# Patient Record
Sex: Female | Born: 1975 | Hispanic: No | Smoking: Never smoker
Health system: Southern US, Community
[De-identification: ages and names within clinical notes are randomized; demographics above are authoritative.]

## PROBLEM LIST (undated history)

## (undated) DIAGNOSIS — F329 Major depressive disorder, single episode, unspecified: Secondary | ICD-10-CM

## (undated) DIAGNOSIS — R51 Headache: Secondary | ICD-10-CM

## (undated) DIAGNOSIS — H539 Unspecified visual disturbance: Secondary | ICD-10-CM

## (undated) DIAGNOSIS — R519 Headache, unspecified: Secondary | ICD-10-CM

## (undated) HISTORY — DX: Headache: R51

## (undated) HISTORY — PX: CHOLECYSTECTOMY OPEN: SUR202

## (undated) HISTORY — DX: Headache, unspecified: R51.9

## (undated) HISTORY — DX: Major depressive disorder, single episode, unspecified: F32.9

## (undated) HISTORY — DX: Unspecified visual disturbance: H53.9

---

## 2001-08-01 ENCOUNTER — Other Ambulatory Visit: Admission: RE | Admit: 2001-08-01 | Discharge: 2001-08-01 | Payer: Self-pay | Admitting: *Deleted

## 2014-08-27 DIAGNOSIS — H469 Unspecified optic neuritis: Secondary | ICD-10-CM | POA: Insufficient documentation

## 2014-09-26 ENCOUNTER — Telehealth: Payer: Self-pay | Admitting: *Deleted

## 2014-09-26 ENCOUNTER — Encounter: Payer: Self-pay | Admitting: Neurology

## 2014-09-26 ENCOUNTER — Ambulatory Visit (INDEPENDENT_AMBULATORY_CARE_PROVIDER_SITE_OTHER): Payer: BC Managed Care – PPO | Admitting: Neurology

## 2014-09-26 VITALS — BP 116/86 | HR 88 | Resp 16 | Ht 67.0 in | Wt 253.6 lb

## 2014-09-26 DIAGNOSIS — H469 Unspecified optic neuritis: Secondary | ICD-10-CM | POA: Diagnosis not present

## 2014-09-26 DIAGNOSIS — R350 Frequency of micturition: Secondary | ICD-10-CM | POA: Diagnosis not present

## 2014-09-26 DIAGNOSIS — R260 Ataxic gait: Secondary | ICD-10-CM | POA: Diagnosis not present

## 2014-09-26 DIAGNOSIS — F329 Major depressive disorder, single episode, unspecified: Secondary | ICD-10-CM | POA: Diagnosis not present

## 2014-09-26 DIAGNOSIS — E559 Vitamin D deficiency, unspecified: Secondary | ICD-10-CM | POA: Diagnosis not present

## 2014-09-26 DIAGNOSIS — F32A Depression, unspecified: Secondary | ICD-10-CM

## 2014-09-26 HISTORY — DX: Depression, unspecified: F32.A

## 2014-09-26 NOTE — Telephone Encounter (Signed)
Release faxed to [facility] on 09/26/14.

## 2014-09-26 NOTE — Progress Notes (Signed)
[facility]  PATIENT: Mary Ross DOB: Jan 25, 1976  REFERRING DOCTOR OR PCP:  Charlaine Dalton, MD, PhD SOURCE: patient and records form [facility]  _________________________________   HISTORICAL  CHIEF COMPLAINT:  Chief Complaint  Patient presents with  . Optic Neuritis    Sts. she began having decreased vision left eye at the end of January-"like there was a layer of vaseline over my eye." She saw her optometrist who ordered an mri, told her she had sinus infection--he rx'd a Z-pack and oral steroids which she sts. didn't help.  Her vision continued to worsen so she saw her opthalmologist who referred her to Dr. Metta Clines.  She sts. Dr. Metta Clines admitted her to [facility] for IV SoluMedrol (5 days).  Sts. he told her mri is inconclusive.  Sts. she also had an lp   . Abnormal MRI    which was negative for oligoclonal bands.  Also sts. all other labwork has been normal./fim    HISTORY OF PRESENT ILLNESS:  I had the pleasure seeing your patient, Mary Ross, at [facility] for neurologic consultation regarding her optic neuritis and possible multiple sclerosis. As you know, she is a 39 year old woman who began to note visual disturbance dye in late January 2016. At the same tome, she also noted left eye pain, worse with movements.  Over week, vision loss spread from the central region to the periphery and she was only able to see out of a small corner of her visual field out of her left eye.   Vision was normal out of the right. She saw an optometrist who noted papilitis and ordered an MRI that showed enhancement of the left optic nerve and also showing a small white matter focus in the right parietal periventricular region.   The focus was on diffusion-weighted images but did not show up on flare or ADC.    It did not enhance. She was given oral steroids but vision continued to worsen. She saw an ophthalmologist who referred her  to Dr. Metta Clines.  She saw Dr. Metta Clines on 08/27/2014 who started a course of IV Solu-Medrol (5 days) and set up a lumbar puncture. Spinal fluid from 09/02/2014 showed no oligoclonal bands and normal IgG index of 0.6. The myelin basic protein was minimally elevated at 2.0. The rest of the studies were normal or negative. She saw Dr. Metta Clines in follow-up on 09/12/2014. She felt her vision was somewhat improved since the course of steroids labs were drawn for ESR, ANA, ACE and and NMO IgG antibody.   She saw him again in follow-up on 09/24/2014. Vision proved for further. The ESR, ANA and Ace were normal or negative. The NMO Ab was not back.    She was referred for a second opinion regarding the possibility of multiple sclerosis.  She feels her vision is better than last month but only about 40% better overall (only mildly better central vision, much better peripheral).   The eye pain went away with the steroids.  She has also noted mild right visual blurring and was found to be only 20/40 with her contact in.    Colors are very washed out OS.    When she looks back in time, she denies any days to weeks long period of numbness, weakness, clumsiness.   She notes no gait issues.   She notes some urinary urgency and frequency over the past 2 months.  She had incontinence twice, one not during the steroid use.  She notes no change in fatigue or cognition.  REVIEW OF SYSTEMS: Constitutional: No fevers, chills, sweats, or change in appetite Eyes: as above.  No current eye pain Ear, nose and throat: No hearing loss, ear pain, nasal congestion, sore throat Cardiovascular: No chest pain, palpitations Respiratory: No shortness of breath at rest or with exertion.   No wheezes GastrointestinaI: No nausea, vomiting, diarrhea, abdominal pain, fecal incontinence Genitourinary: No dysuria or urinary retention.  She reports frequency and nocturia. Musculoskeletal: No neck pain, back pain Integumentary: No  rash, pruritus, skin lesions Neurological: as above Psychiatric: mild depression, better with prozac.  No anxiety Endocrine: No palpitations, diaphoresis, change in appetite, change in weigh or increased thirst Hematologic/Lymphatic: No anemia, purpura, petechiae. Allergic/Immunologic: No itchy/runny eyes, nasal congestion, recent allergic reactions, rashes  ALLERGIES: No Known Allergies  HOME MEDICATIONS:  Current outpatient prescriptions:  .  FLUoxetine (PROZAC) 40 MG capsule, Take 40 mg by mouth daily., Disp: , Rfl:   PAST MEDICAL HISTORY: Past Medical History  Diagnosis Date  . Depression 09/26/2014  . Headache   . Vision abnormalities     PAST SURGICAL HISTORY: Past Surgical History  Procedure Laterality Date  . Cholecystectomy open    . Cesarean section      FAMILY HISTORY: Family History  Problem Relation Age of Onset  . High Cholesterol Mother   . Hyperthyroidism Mother   . Hypertension Father     SOCIAL HISTORY:  History   Social History  . Marital Status: Unknown    Spouse Name: N/A  . Number of Children: N/A  . 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 Smoking status: Never Smoker   . Smokeless tobacco: Not on file  . Alcohol Use: No  . Drug Use: No  . Sexual Activity: Not on file   Other Topics Concern  . Not on file   Social History Narrative  . No narrative on file     PHYSICAL EXAM  Filed Vitals:   09/26/14 1003  BP: 116/86  Pulse: 88  Resp: 16  Height: 5' 7"  (1.702 m)  Weight: 253 lb 9.6 oz (115.032 kg)    Body mass index is 39.71 kg/(m^2).   General: The patient is well-developed and well-nourished and in no acute distress  Eyes:  Funduscopic exam shows optic nerve pallor in the left and normal retinal vessels..  Neck: The neck is supple, no carotid bruits are noted.  The neck is nontender.  Cardiovascular: The heart has  a regular rate and rhythm with a normal S1 and S2. There were no murmurs, gallops or rubs. Lungs are clear to auscultation.  Skin: Extremities are without significant edema.  Musculoskeletal:  Back is nontender  Neurologic Exam  Mental status: The patient is alert and oriented x 3 at the time of the examination. The patient has apparent normal recent and remote memory, with an apparently normal attention span and concentration ability.   Speech is normal.  Cranial nerves: Extraocular movements are full. Pupils show a 2+ APD visual acuity is 20/40 on the right and worse than 20/200 on the left. Color is  very desaturated out of OS..  Visual fields tio confrontation are nearly full on the left.  Facial symmetry is present. There is good facial sensation to soft touch bilaterally.Facial strength is normal.  Trapezius and sternocleidomastoid strength is normal. No dysarthria is noted.  The tongue is midline, and the patient has symmetric elevation of the soft palate. No obvious hearing deficits are noted.  Motor:  Muscle bulk is normal.   Tone is normal. Strength is  5 / 5 in all 4 extremities.   Sensory: Sensory testing is intact to pinprick, soft touch and vibration sensation in all 4 extremities.  Coordination: Cerebellar testing reveals good finger-nose-finger and heel-to-shin bilaterally.  Gait and station: Station is normal.   Gait is normal. Tandem gait is mildly wide. Romberg is negative.   Reflexes: Deep tendon reflexes are symmetric and normal bilaterally.   Plantar responses are flexor.    DIAGNOSTIC DATA (LABS, IMAGING, TESTING) - I reviewed patient records, labs, notes, testing and imaging myself where available.     ASSESSMENT AND PLAN  Optic neuritis - Plan: Vitamin D, 25-hydroxy, MR Cervical Spine W Wo Contrast, MR Thoracic Spine W Wo Contrast  Depression  Ataxic gait - Plan: MR Cervical Spine W Wo Contrast, MR Thoracic Spine W Wo Contrast  Urinary frequency - Plan:  MR Cervical Spine W Wo Contrast, MR Thoracic Spine W Wo Contrast  Vitamin D deficiency - Plan: Vitamin D, 25-hydroxy    In summary, Mary Ross is a 39 year old woman who developed optic neuritis about 8 weeks ago and had an MRI one focus that appeared on diffusion-weighted images but not on FLAIR images. Her recovery is only about 30 or 40% at this point. Vasculitis labs and CSF were negative. NMO antibody is still pending.   She has several symptoms that could be consistent with a mild transverse myelitis including urinary frequency with a couple episodes of incontinence and a slightly ataxic gait. We will check an MRI of the cervical and thoracic spine to assess this possibility. If she does have a spinal cord plaque, MS is much more likely and we will consider starting therapy. I reviewed the results of the optic neuritis treatment trial from the 90s with her and her mom. Study found that patients with one or 2 MRI lesions had a 30% chance of being diagnosed with MS at 5 years, a 50% chance of being diagnosed at 10 years and about a 65% chance of being diagnosed by 15 years.   Therefore, I think the best approach would be to recheck an MRI of the brain in 2-3 months (assuming the spinal MRIs do not lead to a diagnosis of MS) and checking again 9 or 10 months later if that brain MRI shows no new lesions. We will also check a vitamin D level today as low vitamin D has been associated with a higher likelihood of developing MS I will hold off on treating the urinary frequency but would consider oxybutynin or similar agent if symptoms worsen.  She will return to see me in 2 months or sooner if she has new or worsening neurologic symptoms.   Richard A. Felecia Shelling, MD, PhD 5/32/0233, 43:56 AM Certified in Neurology, Clinical Neurophysiology, Sleep Medicine, Pain Medicine and Neuroimaging  [facility] [address], [place], [place] [address] [phone]

## 2014-09-27 ENCOUNTER — Telehealth: Payer: Self-pay | Admitting: *Deleted

## 2014-09-27 LAB — VITAMIN D 25 HYDROXY (VIT D DEFICIENCY, FRACTURES): VIT D 25 HYDROXY: 18.4 ng/mL — AB (ref 30.0–100.0)

## 2014-09-27 NOTE — Telephone Encounter (Signed)
LMTC./fim 

## 2014-09-27 NOTE — Telephone Encounter (Signed)
-----   Message from Richard A Sater, MD sent at 09/27/2014  9:57 AM EDT ----- Vit D is low   50000 U x 12 weeks then 4000-5000 U OTC 

## 2014-09-30 ENCOUNTER — Telehealth: Payer: Self-pay | Admitting: *Deleted

## 2014-09-30 MED ORDER — VITAMIN D (ERGOCALCIFEROL) 1.25 MG (50000 UNIT) PO CAPS: 50000.0000 [IU] | ORAL_CAPSULE | ORAL | Status: DC

## 2014-09-30 NOTE — Telephone Encounter (Signed)
Spoke with Bjorn Loserhonda and per RAS, advised her of low vitamin d level, and of the need for rx. vitamin d 50,000u weekly for 12 weeks, then a daily otc vit. d supplement of 4-5,000u.  She verbalized understanding of same and rx. escribed to [facility] in [place] per her request/fim

## 2014-09-30 NOTE — Telephone Encounter (Signed)
Patient returning call and requesting a return call on cell # [phone].

## 2014-09-30 NOTE — Telephone Encounter (Signed)
-----   Message from Asa Lenteichard A Sater, MD sent at 09/27/2014  9:57 AM EDT ----- Vit D is low   50000 U x 12 weeks then 4000-5000 U OTC

## 2014-09-30 NOTE — Telephone Encounter (Signed)
Spoke with Bjorn Loserhonda and per RAS, advised her of low vitamin d level, and of the need for rx. vit. d 50,000u weekly for 12 weeks, then an otc daily vit. d supplement of 4-5,000iu daily.  Haylynn verbalized understanding of same, and rx. escribed to [facility] in [place] per her request/fim

## 2014-10-03 ENCOUNTER — Other Ambulatory Visit: Payer: BC Managed Care – PPO

## 2014-10-03 ENCOUNTER — Ambulatory Visit (INDEPENDENT_AMBULATORY_CARE_PROVIDER_SITE_OTHER): Payer: BC Managed Care – PPO

## 2014-10-03 DIAGNOSIS — R260 Ataxic gait: Secondary | ICD-10-CM

## 2014-10-03 DIAGNOSIS — H469 Unspecified optic neuritis: Secondary | ICD-10-CM

## 2014-10-03 DIAGNOSIS — R350 Frequency of micturition: Secondary | ICD-10-CM | POA: Diagnosis not present

## 2014-10-04 MED ORDER — GADOPENTETATE DIMEGLUMINE 469.01 MG/ML IV SOLN
20.0000 mL | Freq: Once | INTRAVENOUS | Status: AC | PRN
Start: 2014-10-04 — End: 2014-10-04

## 2014-10-07 ENCOUNTER — Telehealth: Payer: Self-pay | Admitting: Neurology

## 2014-10-07 NOTE — Telephone Encounter (Signed)
I discussed the results of her MRIs. The cervical spine is normal and the thoracic spine is near normal with just minimal degenerative changes. However, in the interim, we have also received her NMO (aquaporin) antibody  And it is positive at 6.3 ( less than 3 is negative and greater than 5.1 is positive ). 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 We will set up an appointment for her to come in within a week or so to discuss options including Rituxan and Imuran/steroid.

## 2014-10-08 ENCOUNTER — Telehealth: Payer: Self-pay | Admitting: *Deleted

## 2014-10-08 NOTE — Telephone Encounter (Signed)
Spoke with Bjorn Loserhonda and per RAS, gave appt. next week (10-14-14 at 1340) to discuss tx. options/fim

## 2014-10-08 NOTE — Telephone Encounter (Signed)
LMTC./fim 

## 2014-10-08 NOTE — Telephone Encounter (Signed)
Spoke with Mary Ross and gave appt. 10-14-14 at 1340 to discuss tx. options/fim

## 2014-10-08 NOTE — Telephone Encounter (Signed)
-----   Message from Asa Lenteichard A Sater, MD sent at 10/07/2014  6:20 PM EDT ----- I spoke to her about NMO -Ab (positive) and MRI's (esentially negative)  Please call her to set up an appt to see me next week to go over treatment options (rituxan vs. Imuran in more detail) and to check labs

## 2014-10-08 NOTE — Telephone Encounter (Signed)
Patient is returning your call.    Thanks

## 2014-10-08 NOTE — Telephone Encounter (Signed)
-----   Message from Richard A Sater, MD sent at 10/07/2014  6:20 PM EDT ----- I spoke to her about NMO -Ab (positive) and MRI's (esentially negative)  Please call her to set up an appt to see me next week to go over treatment options (rituxan vs. Imuran in more detail) and to check labs 

## 2014-10-14 ENCOUNTER — Encounter: Payer: Self-pay | Admitting: Neurology

## 2014-10-14 ENCOUNTER — Ambulatory Visit (INDEPENDENT_AMBULATORY_CARE_PROVIDER_SITE_OTHER): Payer: BC Managed Care – PPO | Admitting: Neurology

## 2014-10-14 VITALS — BP 120/86 | HR 88 | Resp 16 | Ht 67.0 in | Wt 257.2 lb

## 2014-10-14 DIAGNOSIS — G360 Neuromyelitis optica [Devic]: Secondary | ICD-10-CM | POA: Diagnosis not present

## 2014-10-14 DIAGNOSIS — H469 Unspecified optic neuritis: Secondary | ICD-10-CM

## 2014-10-14 DIAGNOSIS — Z79899 Other long term (current) drug therapy: Secondary | ICD-10-CM | POA: Insufficient documentation

## 2014-10-14 NOTE — Progress Notes (Signed)
[facility]  PATIENT: Mary Ross DOB: [birth year]  REFERRING DOCTOR OR PCP:  Charlaine Dalton, MD, PhD SOURCE: patient and records form [facility]  _________________________________   HISTORICAL  CHIEF COMPLAINT:  Chief Complaint  Patient presents with  . Optic Neuritis    To discuss tx. options.  Sts. vision in left eye is improving/fim    HISTORY OF PRESENT ILLNESS:  Optic Neuritis history:   Mary Ross began to note visual disturbance on the left in late January 2016.  She had eye pain  Over a week, vision loss spread from the central region to the periphery and she was only able to see out of a small corner of her visual field out of her left eye.   Vision was normal out of the right. She saw an optometrist who noted papilitis and ordered an MRI that showed enhancement of the left optic nerve and also showing a small white matter focus in the right parietal periventricular region.   The focus was on diffusion-weighted images but did not show up on flare or ADC.    It did not enhance.   She saw Dr. Metta Clines on 08/27/2014 who started a course of IV Solu-Medrol (5 days) and set up a lumbar puncture. Spinal fluid from 09/02/2014 showed no oligoclonal bands and normal IgG index of 0.6. The myelin basic protein was minimally elevated at 2.0. The rest of the studies were normal or negative. She saw Dr. Metta Clines in follow-up on 09/12/2014. She felt her vision was somewhat improved since the course of steroids labs were drawn for ESR, ANA, ACE and and NMO IgG antibody.   She saw him again in follow-up on 09/24/2014. Vision proved for further. The ESR, ANA and Ace were normal or negative. The NMO Ab returned positive at 6.3.   She feels her vision is better than last visit but not baseline.   Colors are very washed out OS.    When she looks back in time, she denies any days to weeks long period of numbness, weakness, clumsiness.   She notes no gait issues.    She notes some urinary urgency and frequency over the past 2 months.  She had incontinence twice, one not during the steroid use.    She notes no change in fatigue or cognition.  Last month, we also checked spinal MRI and it was normal.  She returns today with parents to discuss treatment options  REVIEW OF SYSTEMS: Constitutional: No fevers, chills, sweats, or change in appetite.  No fatigue Eyes: as above.  No current eye pain Ear, nose and throat: No hearing loss, ear pain, nasal congestion, sore throat Cardiovascular: No chest pain, palpitations Respiratory: No shortness of breath at rest or with exertion.   No wheezes GastrointestinaI: No nausea, vomiting, diarrhea, abdominal pain, fecal incontinence Genitourinary: No dysuria or urinary retention.  She reports frequency and nocturia. Musculoskeletal: No neck pain.  Notes back pain Integumentary: No rash, pruritus, skin lesions Neurological: as above Psychiatric: mild depression, better with prozac.  No anxiety Endocrine: No palpitations, diaphoresis, change in appetite, change in weigh or increased thirst Hematologic/Lymphatic: No anemia, purpura, petechiae. Allergic/Immunologic: No itchy/runny eyes, nasal congestion, recent allergic reactions, rashes  ALLERGIES: No Known Allergies  HOME MEDICATIONS:  Current outpatient prescriptions:  .  FLUoxetine (PROZAC) 40 MG capsule, Take 40 mg by mouth daily., Disp: , Rfl:  .  Vitamin D, Ergocalciferol, (DRISDOL) 50000 UNITS CAPS capsule, Take 1 capsule (50,000 Units total) by mouth every  7 (seven) days., Disp: 12 capsule, Rfl: 0  PAST MEDICAL HISTORY: Past Medical History  Diagnosis Date  . Depression 09/26/2014  . Headache   . Vision abnormalities     PAST SURGICAL HISTORY: Past Surgical History  Procedure Laterality Date  . Cholecystectomy open    . Cesarean section      FAMILY HISTORY: Family History  Problem Relation Age of Onset  . High Cholesterol Mother   .  Hyperthyroidism Mother   . Hypertension Father     SOCIAL HISTORY:  History   Social History  . Marital Status: Unknown    Spouse Name: N/A  . Number of Children: N/A  . 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 Smoking status: Never Smoker   . Smokeless tobacco: Not on file  . Alcohol Use: No  . Drug Use: No  . Sexual Activity: Not on file   Other Topics Concern  . Not on file   Social History Narrative     PHYSICAL EXAM  Filed Vitals:   10/14/14 1359  BP: 120/86  Pulse: 88  Resp: 16  Height: _0  (1.702 m)  Weight: 257 lb 3.2 oz (116.665 kg)    Body mass index is 40.27 kg/(m^2).   General: The patient is well-developed and well-nourished and in no acute distress  Neurologic Exam  Mental status: The patient is alert and oriented x 3 at the time of the examination. The patient has apparent normal recent and remote memory, with an apparently normal attention span and concentration ability.   Speech is normal.  Cranial nerves: Extraocular movements are full. Pupils show a 2+ APD on the right. Color is  very desaturated out of OS.. VA is 20/30 OD and 20/70 OS (was 20/200 last month) .  Facial symmetry is present. There is good facial sensation to soft touch bilaterally.Facial strength is normal.  Trapezius and sternocleidomastoid strength is normal. No dysarthria is noted.  The tongue is midline, and the patient has symmetric elevation of the soft palate.   Motor:  Muscle bulk is normal.   Tone is normal. Strength is  5 / 5 in all 4 extremities. .  Coordination: Cerebellar testing reveals good finger-nose-finger bilaterally.  Gait and station: Station is normal.   Gait is normal. Tandem gait is minimally  wide.     DIAGNOSTIC DATA (LABS, IMAGING, TESTING) - I reviewed patient records, labs, notes, testing and imaging myself where available.     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, Hep B Surface Antibody, Hep B Surface Antigen, Hepatitis B Core AB, Total, Quantiferon tb gold assay, Lymphocyte subsets, flow cytometry (inpatient)  High risk medication use - Plan: Hep C Antibody, Hep B Surface Antibody, Hep B Surface Antigen, Hepatitis B Core AB, Total, Quantiferon tb gold assay, Lymphocyte subsets, flow cytometry (inpatient)  Optic neuritis - Plan: Hep C Antibody, Hep B Surface Antibody, Hep B Surface Antigen, Hepatitis B Core AB, Total, Quantiferon tb gold assay, Lymphocyte subsets, flow cytometry (inpatient)     In summary, Denver Harder is a 39 year old woman who developed optic neuritis earlier this year weeks ago and had an MRI showing one focus that appeared on diffusion-weighted images but not on FLAIR images. Her NMO-Ab was positive (about 6.3 with < 1.5 normal).  I had about a 30 minute conversation with her and her  parents about Devic's disease, possible treatments and the risks and benefits of the 2 most used treatments, steroids/Imuran and rituximab. I feel that rituximab is a more efficacious medication for her and will allow her to be spared the side effects of long-term use of steroids.    She understands that there could be a small risk of PML and other infections. She also understands that there is no FDA approved medication for Devic's disease as it is fairly rare after further discussion we decided to set her up for rituximab therapy 1000 mg, followed 2 weeks later by another 1000 milligrams IV and repeated in 6 month cycles.  She will return to see this office for her infusion and see me in again about 2 months later or sooner if she has new or worsening neurologic symptoms.  The 30 minute face-to-face interaction with greater than 50% of time counseling and coordinating care about Devic's disease, treatment options and other issues.   Richard A. Felecia Shelling, MD, PhD 10/12/5389, 2:25 PM Certified in Neurology, Clinical  Neurophysiology, Sleep Medicine, Pain Medicine and Neuroimaging  [facility] [address], [place], [place] [address] [phone]

## 2014-10-15 LAB — SPECIMEN STATUS REPORT

## 2014-10-16 LAB — QUANTIFERON IN TUBE
QUANTIFERON MITOGEN VALUE: >10 IU/mL
QUANTIFERON TB AG VALUE: 0.05 IU/mL
QUANTIFERON TB GOLD: NEGATIVE
Quantiferon Nil Value: 0.07 IU/mL

## 2014-10-16 LAB — HEPATITIS C ANTIBODY: Hep C Virus Ab: <0.1 s/co ratio (ref 0.0–0.9)

## 2014-10-16 LAB — HEPATITIS B SURFACE ANTIBODY,QUALITATIVE: HEP B SURFACE AB, QUAL: REACTIVE

## 2014-10-16 LAB — QUANTIFERON TB GOLD ASSAY (BLOOD)

## 2014-10-16 LAB — HEPATITIS B SURFACE ANTIGEN: Hepatitis B Surface Ag: NEGATIVE

## 2014-10-16 LAB — HEPATITIS B CORE ANTIBODY, TOTAL: Hep B Core Total Ab: NEGATIVE

## 2014-10-22 ENCOUNTER — Telehealth: Payer: Self-pay | Admitting: *Deleted

## 2014-10-22 NOTE — Telephone Encounter (Signed)
Spoke with Mary Ross and per RAS, advised Hep labs are negative; Rituxan orders have been given to [facility] in the infusion suite, and she should expect a call to schedule infusions once ins. has approved.  She verbalized understanding of same/fim

## 2014-10-22 NOTE — Telephone Encounter (Signed)
-----   Message from Asa Lenteichard A Sater, MD sent at 10/21/2014  4:54 PM EDT ----- Labs are fine    Please check to make sure Rituxan has  been ordered

## 2014-11-26 ENCOUNTER — Ambulatory Visit: Payer: BC Managed Care – PPO | Admitting: Neurology

## 2014-12-11 ENCOUNTER — Other Ambulatory Visit: Payer: Self-pay | Admitting: Neurology

## 2014-12-12 ENCOUNTER — Encounter: Payer: Self-pay | Admitting: *Deleted

## 2014-12-23 ENCOUNTER — Ambulatory Visit: Payer: BC Managed Care – PPO | Admitting: Neurology

## 2014-12-25 ENCOUNTER — Encounter: Payer: Self-pay | Admitting: Neurology

## 2015-01-10 ENCOUNTER — Ambulatory Visit: Payer: Self-pay | Admitting: Neurology

## 2015-01-14 ENCOUNTER — Ambulatory Visit (INDEPENDENT_AMBULATORY_CARE_PROVIDER_SITE_OTHER): Payer: BC Managed Care – PPO | Admitting: Neurology

## 2015-01-14 ENCOUNTER — Encounter: Payer: Self-pay | Admitting: Neurology

## 2015-01-14 VITALS — BP 126/74 | HR 70 | Resp 14 | Ht 67.0 in | Wt 278.0 lb

## 2015-01-14 DIAGNOSIS — G360 Neuromyelitis optica [Devic]: Secondary | ICD-10-CM | POA: Diagnosis not present

## 2015-01-14 DIAGNOSIS — R292 Abnormal reflex: Secondary | ICD-10-CM | POA: Diagnosis not present

## 2015-01-14 DIAGNOSIS — H469 Unspecified optic neuritis: Secondary | ICD-10-CM

## 2015-01-14 DIAGNOSIS — R208 Other disturbances of skin sensation: Secondary | ICD-10-CM

## 2015-01-14 NOTE — Progress Notes (Signed)
[facility]  PATIENT: Mary GoodellRhonda Ross DOB: 1976/07/05  REFERRING DOCTOR OR PCP:  Dale DurhamMichael Applegate, MD, PhD SOURCE: patient and records form [facility]  _________________________________   HISTORICAL  CHIEF COMPLAINT:  Chief Complaint  Patient presents with  . Devic's Disease    She sts. she tolerates Rituxan well.  Last infusions were 12-12-14 and 12-26-14.  Sts. her only complaint is continued achy pain bilat arms./fim    HISTORY OF PRESENT ILLNESS:  Devic's:   She tolerated the two Rituxan infusions without difficulty. Specifically, there was no infusion reaction and no allergic reactions afterwards.     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 Visual acuity has improved but not to baseline.  Colors are washed out OS.    Dysesthesia:  The second week in June, she began to note mild aching dysesthetic sensations in the upper arms bilateral.    Cervical spine MRI was normal in March 2016  When she looks back in time, she denies any days to weeks long period of numbness, weakness, clumsiness.   She notes no gait issues.   She notes some urinary urgency and frequency over the past 2 months.  She had incontinence twice, one not during the steroid use.    She notes no change in fatigue or cognition.   REVIEW OF SYSTEMS: Constitutional: No fevers, chills, sweats, or change in appetite.  No fatigue Eyes: as above.  No current eye pain Ear, nose and throat: No hearing loss, ear pain, nasal congestion, sore throat Cardiovascular: No chest pain, palpitations Respiratory: No shortness of breath at rest or with exertion.   No wheezes GastrointestinaI: No nausea, vomiting, diarrhea, abdominal pain, fecal incontinence Genitourinary: No dysuria or urinary retention.  She reports frequency and  nocturia. Musculoskeletal: No neck pain.  Notes back pain Integumentary: No rash, pruritus, skin lesions Neurological: as above Psychiatric: mild depression, better with prozac.  No anxiety Endocrine: No palpitations, diaphoresis, change in appetite, change in weigh or increased thirst Hematologic/Lymphatic: No anemia, purpura, petechiae. Allergic/Immunologic: No itchy/runny eyes, nasal congestion, recent allergic reactions, rashes  ALLERGIES: No Known Allergies  HOME MEDICATIONS:  Current outpatient prescriptions:  .  FLUoxetine (PROZAC) 40 MG capsule, Take 40 mg by mouth daily., Disp: , Rfl:  .  riTUXimab (RITUXAN) 100 MG/10ML injection, Inject 1,000 mg into the vein., Disp: , Rfl:  .  Vitamin D, Ergocalciferol, (DRISDOL) 50000 UNITS CAPS capsule, Take 1 capsule (50,000 Units total) by mouth every 7 (seven) days., Disp: 12 capsule, Rfl: 0  PAST MEDICAL HISTORY: Past Medical History  Diagnosis Date  . Depression 09/26/2014  . Headache   . Vision abnormalities     PAST SURGICAL HISTORY: Past Surgical History  Procedure Laterality Date  . Cholecystectomy open    . Cesarean section      FAMILY HISTORY: Family History  Problem Relation Age of Onset  . High Cholesterol Mother   . Hyperthyroidism Mother   . Hypertension Father     SOCIAL HISTORY:  History   Social History  . Marital Status: Unknown    Spouse Name: N/A  . Number of Children: N/A  . Years of Education: N/A   Occupational History  . Health Educator     [employer]   Social History Main Topics  . Smoking status: Never Smoker   . Smokeless tobacco:  Not on file  . Alcohol Use: No  . Drug Use: No  . Sexual Activity: Not on file   Other Topics Concern  . Not on file   Social History Narrative     PHYSICAL EXAM  Filed Vitals:   01/14/15 1142  BP: 126/74  Pulse: 70  Resp: 14  Height:  (1.702 m)  Weight: 278 lb (126.1 kg)    Body mass index is 43.53  kg/(m^2).   General: The patient is well-developed and well-nourished and in no acute distress  Neurologic Exam  Mental status: The patient is alert and oriented x 3 at the time of the examination. The patient has apparent normal recent and remote memory, with an apparently normal attention span and concentration ability.   Speech is normal.  Cranial nerves: Extraocular movements are full. Pupils show a 2+ APD on the right. Color is  desaturated out of OS.Marland Kitchen VFFTC.    Facial symmetry is present. There is good facial sensation to soft touch bilaterally.Facial strength is normal.  Trapezius and sternocleidomastoid strength is normal. No dysarthria is noted.  The tongue is midline, and the patient has symmetric elevation of the soft palate.   Motor:  Muscle bulk is normal.   Tone is normal. Strength is  5 / 5 in all 4 extremities.   Sensation:  She has intact touch and vibration sensation in the arms and legs.  Coordination: Cerebellar testing reveals good finger-nose-finger bilaterally.  Gait and station: Station is normal.   Gait is normal. Tandem gait is wide.   Romberg sign is absent.  DTRs:  Elevated reflexes at the knees with spread at the ankles with nonsustained clonus.    DIAGNOSTIC DATA (LABS, IMAGING, TESTING) - I reviewed patient records, labs, notes, testing and imaging myself where available.     ASSESSMENT AND PLAN  Devic's disease  Optic neuritis  Dysesthesia  Hyperreflexia of lower extremity   1.  Consider repeat imaging of the cervical spine if the arm dysesthesias or gait issues worsened. If this does worsen, I would consider a course of IV steroids as well.  2.   Her next infusions will be in December. I will want to see her back in early November. At that time, I will check a CD19 count to make sure that her B cells remain 0 and to check to see if the anti-NMO antibody titer has improved.  3.  She will call sooner if any new or worsening neurologic  symptoms.   Richard A. Epimenio Foot, MD, PhD 01/14/2015, 11:55 AM Certified in Neurology, Clinical Neurophysiology, Sleep Medicine, Pain Medicine and Neuroimaging  [facility] [address], [address] [place], [place] [address] [phone]

## 2015-05-19 ENCOUNTER — Ambulatory Visit (INDEPENDENT_AMBULATORY_CARE_PROVIDER_SITE_OTHER): Payer: BC Managed Care – PPO | Admitting: Neurology

## 2015-05-19 ENCOUNTER — Encounter: Payer: Self-pay | Admitting: Neurology

## 2015-05-19 VITALS — BP 118/88 | HR 78 | Resp 16 | Ht 67.0 in | Wt 278.4 lb

## 2015-05-19 DIAGNOSIS — F329 Major depressive disorder, single episode, unspecified: Secondary | ICD-10-CM

## 2015-05-19 DIAGNOSIS — G360 Neuromyelitis optica [Devic]: Secondary | ICD-10-CM | POA: Diagnosis not present

## 2015-05-19 DIAGNOSIS — F32A Depression, unspecified: Secondary | ICD-10-CM

## 2015-05-19 DIAGNOSIS — Z79899 Other long term (current) drug therapy: Secondary | ICD-10-CM

## 2015-05-19 DIAGNOSIS — R350 Frequency of micturition: Secondary | ICD-10-CM

## 2015-05-19 NOTE — Progress Notes (Signed)
[facility]  PATIENT: Mary Ross DOB: 05-08-1976   _________________________________   HISTORICAL  CHIEF COMPLAINT:  Chief Complaint  Patient presents with  . Devic's Disease    Denies new or worsening sx. Last Rituxan infusions were in June.  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  She had Rituxan 1000 mg x 2 in June.    Her infusions went well.   She felt sleepy with Benadryl.   She has not noted any new symptoms since.     Devic's Disease:   She noted visual disturbance on the left in late January 2016.  She had eye pain.  Over a week, vision loss spread from the central region to the periphery and she was only able to see out of a small corner of her visual field out of her left eye.   Vision was normal out of the right. She saw an optometrist who noted papilitis and ordered an MRI that showed enhancement of the left optic nerve and also showing a small white matter focus in the right parietal periventricular region.   The focus was on diffusion-weighted images but did not show up on flare or ADC.    It did not enhance.   She saw Dr. Adella Hare on 08/27/2014 who started a course of IV Solu-Medrol (5 days) and set up a lumbar puncture. Spinal fluid from 09/02/2014 showed no oligoclonal bands and normal IgG index of 0.6. The myelin basic protein was minimally elevated at 2.0. The rest of the studies were normal or negative.  Vision improved but not to baseline.   Colors are very washed out OS.    The ESR, ANA and Ace were normal or negative. The NMO Ab returned positive at 6.3.    She has occasionall episodes of lightheadedness.     Gait/strength/sensation:   She feels gait is doing well and denies any recent falls.   She feels strength and sensation is normal in the arms and legs.   With eyes closed she feels she wobbles.      Bladder and bowel function are fine.    The mild urgency/frequency have resolved.     Fatigue:   She notes fatigue many days but notes others around her do as well.   She sleeps well at night.   Cognition is fine.   Mood is better on Prozac and she denies current depression.      REVIEW OF SYSTEMS: Constitutional: No fevers, chills, sweats, or change in appetite.  No fatigue Eyes: as above.  No current eye pain Ear, nose and throat: No hearing loss, ear pain, nasal congestion, sore throat Cardiovascular: No chest pain, palpitations Respiratory: No shortness of breath at rest or with exertion.   No wheezes GastrointestinaI: No nausea, vomiting, diarrhea, abdominal pain, fecal incontinence Genitourinary: No dysuria or urinary retention.  She reports frequency and nocturia. Musculoskeletal: No neck pain.  Notes back pain Integumentary: No rash, pruritus, skin lesions Neurological: as above Psychiatric: mild depression, better with prozac.  No anxiety Endocrine: No palpitations, diaphoresis, change in appetite, change in weigh or increased thirst Hematologic/Lymphatic: No anemia, purpura, petechiae. Allergic/Immunologic: No itchy/runny eyes, nasal congestion, recent allergic reactions, rashes  ALLERGIES: No Known Allergies  HOME MEDICATIONS:  Current outpatient prescriptions:  .  FLUoxetine (PROZAC) 40 MG capsule, Take 40 mg by mouth  daily., Disp: , Rfl:  .  riTUXimab (RITUXAN) 100 MG/10ML injection, Inject 1,000 mg into the vein., Disp: , Rfl:   PAST MEDICAL HISTORY: Past Medical History  Diagnosis Date  . Depression 09/26/2014  . Headache   . Vision abnormalities     PAST SURGICAL HISTORY: Past Surgical History  Procedure Laterality Date  . Cholecystectomy open    . Cesarean section      FAMILY HISTORY: Family History  Problem Relation Age of Onset  . High Cholesterol Mother   . Hyperthyroidism Mother   . Hypertension Father     SOCIAL HISTORY:  Social  History   Social History  . Marital Status: Unknown    Spouse Name: N/A  . Number of Children: N/A  . 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 Smoking status: Never Smoker   . Smokeless tobacco: Not on file  . Alcohol Use: No  . Drug Use: No  . Sexual Activity: Not on file   Other Topics Concern  . Not on file   Social History Narrative     PHYSICAL EXAM  Filed Vitals:   05/19/15 1002  BP: 118/88  Pulse: 78  Resp: 16  Height: $Remove'5\' 7"'cUhoEjC$  (1.702 m)  Weight: 278 lb 6.4 oz (126.281 kg)    Body mass index is 43.59 kg/(m^2).   General: The patient is well-developed and well-nourished and in no acute distress  Neurologic Exam  Mental status: The patient is alert and oriented x 3 at the time of the examination. The patient has apparent normal recent and remote memory, with an apparently normal attention span and concentration ability.   Speech is normal.  Cranial nerves: Extraocular movements are full. Pupils show a 2+ APD on the right. Color is  very desaturated out of OS.. VA is 20/30 OD and 20/70 OS  .  Facial symmetry is present. There is good facial sensation to soft touch bilaterally.Facial strength is normal.  Trapezius and sternocleidomastoid strength is normal. No dysarthria is noted.  The tongue is midline, and the patient has symmetric elevation of the soft palate.   Hearing is symmetric.  Motor:  Muscle bulk is normal.   Tone is normal. Strength is  5 / 5 in all 4 extremities. .  Sensory:    She reports normal and symmetric sensation to touch, temperature and vibration.  Coordination: Cerebellar testing reveals good finger-nose-finger bilaterally.  Gait and station: Station is normal.   Gait is normal. Tandem gait is minimally  wide.     DIAGNOSTIC DATA (LABS, IMAGING, TESTING) - I reviewed patient records, labs, notes, testing and imaging myself where  available.     ASSESSMENT AND PLAN  Devic's disease ([place]) - Plan: Neuromyelitis optica autoab, IgG, CBC with Differential/Platelet, CD19 and CD20, Flow Cytometry, Hepatic function panel  High risk medication use - Plan: Neuromyelitis optica autoab, IgG, CBC with Differential/Platelet, CD19 and CD20, Flow Cytometry, Hepatic function panel  Depression  Urinary frequency   1.  Set her up for rituximab therapy 1000 mg, followed 2 weeks later by another 1000 milligrams IV and repeated in 6 month cycles.   Next dose will be in December.     2.   Check cD19/CD20 and anti-NMO Ab 3.    Continue to be active and exercises as tolerated.  She will return to see this office for her infusion and see  me in again about 5-6 months or sooner if she has new or worsening neurologic symptoms.   Daesia Zylka A. Felecia Shelling, MD, PhD 67/07/6406, 90:97 AM Certified in Neurology, Clinical Neurophysiology, Sleep Medicine, Pain Medicine and Neuroimaging  [facility] [address], [place], [place] [address] [phone]

## 2015-05-20 LAB — SPECIMEN STATUS REPORT

## 2015-05-22 ENCOUNTER — Telehealth: Payer: Self-pay | Admitting: *Deleted

## 2015-05-22 LAB — CBC WITH DIFFERENTIAL/PLATELET
Basophils Absolute: 0 10*3/uL (ref 0.0–0.2)
Basos: 1 %
EOS (ABSOLUTE): 0.1 10*3/uL (ref 0.0–0.4)
EOS: 2 %
HEMATOCRIT: 39.2 % (ref 34.0–46.6)
HEMOGLOBIN: 13.3 g/dL (ref 11.1–15.9)
IMMATURE GRANULOCYTES: 0 %
Immature Grans (Abs): 0 10*3/uL (ref 0.0–0.1)
Lymphocytes Absolute: 1.7 10*3/uL (ref 0.7–3.1)
Lymphs: 40 %
MCH: 29.2 pg (ref 26.6–33.0)
MCHC: 33.9 g/dL (ref 31.5–35.7)
MCV: 86 fL (ref 79–97)
MONOCYTES: 9 %
Monocytes Absolute: 0.4 10*3/uL (ref 0.1–0.9)
NEUTROS PCT: 48 %
Neutrophils Absolute: 2.1 10*3/uL (ref 1.4–7.0)
Platelets: 258 10*3/uL (ref 150–379)
RBC: 4.56 x10E6/uL (ref 3.77–5.28)
RDW: 13.8 % (ref 12.3–15.4)
WBC: 4.2 10*3/uL (ref 3.4–10.8)

## 2015-05-22 LAB — CD19 AND CD20, FLOW CYTOMETRY
% CD19: NEGATIVE %
% CD20: NEGATIVE %

## 2015-05-22 LAB — HEPATIC FUNCTION PANEL
ALBUMIN: 4.2 g/dL (ref 3.5–5.5)
ALT: 21 IU/L (ref 0–32)
AST: 18 IU/L (ref 0–40)
Alkaline Phosphatase: 60 IU/L (ref 39–117)
BILIRUBIN TOTAL: 0.4 mg/dL (ref 0.0–1.2)
BILIRUBIN, DIRECT: 0.11 mg/dL (ref 0.00–0.40)
TOTAL PROTEIN: 6.8 g/dL (ref 6.0–8.5)

## 2015-05-22 LAB — NEUROMYELITIS OPTICA AUTOAB, IGG: NMO-IgG: 3 U/mL (ref 0.0–3.0)

## 2015-05-22 NOTE — Telephone Encounter (Signed)
-----   Message from Asa Lenteichard A Sater, MD sent at 05/22/2015  2:25 PM EST ----- Please let her know that the labs look good. The Rituxan is eliminating the B cells as it should. Her NMO IgG is now borderline positive (improved)

## 2015-05-22 NOTE — Telephone Encounter (Signed)
I have spoken with Bjorn LoserRhonda this afternoon and per RAS, advised that labs look good; Rituxan is eliminating B cells and NMO igG is improved.  She verbalized understanding of same/fim

## 2015-07-13 DIAGNOSIS — G360 Neuromyelitis optica [Devic]: Secondary | ICD-10-CM

## 2015-07-13 HISTORY — DX: Neuromyelitis optica (devic): G36.0

## 2015-11-17 ENCOUNTER — Encounter: Payer: Self-pay | Admitting: Neurology

## 2015-11-17 ENCOUNTER — Ambulatory Visit (INDEPENDENT_AMBULATORY_CARE_PROVIDER_SITE_OTHER): Payer: BC Managed Care – PPO | Admitting: Neurology

## 2015-11-17 VITALS — BP 124/70 | HR 72 | Resp 18 | Ht 67.0 in | Wt 268.0 lb

## 2015-11-17 DIAGNOSIS — G360 Neuromyelitis optica [Devic]: Secondary | ICD-10-CM | POA: Diagnosis not present

## 2015-11-17 DIAGNOSIS — H469 Unspecified optic neuritis: Secondary | ICD-10-CM | POA: Diagnosis not present

## 2015-11-17 DIAGNOSIS — R292 Abnormal reflex: Secondary | ICD-10-CM

## 2015-11-17 DIAGNOSIS — R208 Other disturbances of skin sensation: Secondary | ICD-10-CM | POA: Diagnosis not present

## 2015-11-17 DIAGNOSIS — Z79899 Other long term (current) drug therapy: Secondary | ICD-10-CM

## 2015-11-17 MED ORDER — PHENTERMINE HCL 37.5 MG PO CAPS: 37.5000 mg | ORAL_CAPSULE | ORAL | Status: DC

## 2015-11-17 NOTE — Progress Notes (Signed)
[facility]  PATIENT: Mary Ross DOB: July 14, 1975   _________________________________   HISTORICAL  CHIEF COMPLAINT:  Chief Complaint  Patient presents with  . Devic's Disease    Sts. she continues to tolerate Rituxan well.  Last infusions were 06-16-15 and 06-30-15.  Denies new or worsening sx/fim    HISTORY OF PRESENT ILLNESS:  Mary Ross is a 40 yo woman with Devic's disease, manifested mostly with optic neuritis on the left.  She had Rituxan 1000 mg x 2 in June and X 2 in December.    Her infusions went well both cycles.   She felt sleepy with Benadryl.   She has not noted any new symptoms since.     Gait/strength/sensation:   She feels gait is doing well and denies any recent falls.   She feels strength and sensation is normal in the arms and legs.  She gets painful dysesthesias in her arms and feet intermittently.    With eyes closed she feels she wobbles.     Vision: Although she had a complete recovery of her visual acuity, she does note that she will have blurry vision when she gets hot and in hot weather.  Bladder and bowel function are fine.    The mild urgency/frequency have resolved.     Fatigue:   She notes fatigue many days but notes others around her do as well.  Her fatigue gets worse as the day goes on and much worse with heat.    She sleeps well at night.   Cognition is fine.   Mood is better on Prozac and she denies current depression.     Devic's Disease:   She noted visual disturbance on the left in late January 2016.  She had eye pain.  Over a week, vision loss spread from the central region to the periphery and she was only able to see out of a small corner of her visual field out of her left eye.   Vision was normal out of the right. She saw an optometrist who noted papilitis and ordered an MRI that showed enhancement of the left optic nerve and also showing a small white matter focus in the right parietal periventricular region.   The  focus was on diffusion-weighted images but did not show up on flare or ADC.    It did not enhance.   She saw Dr. Metta Clines on 08/27/2014 who started a course of IV Solu-Medrol (5 days) and set up a lumbar puncture. Spinal fluid from 09/02/2014 showed no oligoclonal bands and normal IgG index of 0.6. The myelin basic protein was minimally elevated at 2.0. The rest of the studies were normal or negative.  Vision improved but not to baseline.     The ESR, ANA and Ace were normal or negative. The NMO Ab returned positive at 6.3.     REVIEW OF SYSTEMS: Constitutional: No fevers, chills, sweats, or change in appetite.  No fatigue Eyes: as above.  No current eye pain Ear, nose and throat: No hearing loss, ear pain, nasal congestion, sore throat Cardiovascular: No chest pain, palpitations Respiratory: No shortness of breath at rest or with exertion.   No wheezes GastrointestinaI: No nausea, vomiting, diarrhea, abdominal pain, fecal incontinence Genitourinary: No dysuria or urinary retention.  She reports frequency and nocturia. Musculoskeletal: No neck pain.  Notes back pain Integumentary: No rash, pruritus, skin lesions Neurological: as above Psychiatric: mild depression, better with prozac.  No anxiety Endocrine: No palpitations, diaphoresis, change in  appetite, change in weigh or increased thirst Hematologic/Lymphatic: No anemia, purpura, petechiae. Allergic/Immunologic: No itchy/runny eyes, nasal congestion, recent allergic reactions, rashes  ALLERGIES: No Known Allergies  HOME MEDICATIONS:  Current outpatient prescriptions:  .  FLUoxetine (PROZAC) 40 MG capsule, Take 40 mg by mouth daily., Disp: , Rfl:  .  riTUXimab (RITUXAN) 100 MG/10ML injection, Inject 1,000 mg into the vein., Disp: , Rfl:   PAST MEDICAL HISTORY: Past Medical History  Diagnosis Date  . Depression 09/26/2014  . Headache   . Vision abnormalities     PAST SURGICAL HISTORY: Past Surgical History  Procedure  Laterality Date  . Cholecystectomy open    . Cesarean section      FAMILY HISTORY: Family History  Problem Relation Age of Onset  . High Cholesterol Mother   . Hyperthyroidism Mother   . Hypertension Father     SOCIAL HISTORY:  Social History   Social History  . Marital Status: Unknown    Spouse Name: N/A  . Number of Children: N/A  . 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 Smoking status: Never Smoker   . Smokeless tobacco: Not on file  . Alcohol Use: No  . Drug Use: No  . Sexual Activity: Not on file   Other Topics Concern  . Not on file   Social History Narrative     PHYSICAL EXAM  Filed Vitals:   11/17/15 1010  BP: 124/70  Pulse: 72  Resp: 18  Height: _0  (1.702 m)  Weight: 268 lb (121.564 kg)    Body mass index is 41.96 kg/(m^2).   General: The patient is well-developed and well-nourished and in no acute distress  Neurologic Exam  Mental status: The patient is alert and oriented x 3 at the time of the examination. The patient has apparent normal recent and remote memory, with an apparently normal attention span and concentration ability.   Speech is normal.  Cranial nerves: Extraocular movements are full. Pupils show a 2+ APD on the right. Color is desaturated out of OS.. VA is better, 20/30 OD and 20/40 OS  .  Facial symmetry is present. There is good facial sensation to soft touch bilaterally.Facial strength is normal.  Trapezius and sternocleidomastoid strength is normal. No dysarthria is noted.  The tongue is midline, and the patient has symmetric elevation of the soft palate.   Hearing is symmetric.  Motor:  Muscle bulk is normal.   Tone is normal. Strength is  5 / 5 in all 4 extremities. .  Sensory:    She reports normal and symmetric sensation to touch, temperature and vibration.  Coordination: Cerebellar testing reveals good  finger-nose-finger bilaterally.  Gait and station: Station is normal.   Gait is normal. Tandem gait is mildly wide.   DTR's:   DTR's are 3+ at knees but no spread.   2+ at ankles, no clonus.      DIAGNOSTIC DATA (LABS, IMAGING, TESTING) - I reviewed patient records, labs, notes, testing and imaging myself where available.     ASSESSMENT AND PLAN  Devic's disease ([place])  Optic neuritis  High risk medication use  Hyperreflexia of lower extremity  Dysesthesia   1.  Set her up for rituximab therapy 1000 mg.   We will just do the single dose IV this cycle and make sure to check labs around 5 months later.   Next  dose will be in June and following dose in December.     2.   Check CBC, cD19/CD20 and anti-NMO Ab 3.    Continue to be active and exercises as tolerated.  She will return to see this office for her infusion and see me in again about 5-6 months or sooner if she has new or worsening neurologic symptoms.   Eleora Sutherland A. Felecia Shelling, MD, PhD 0/07/270, 53:66 AM Certified in Neurology, Clinical Neurophysiology, Sleep Medicine, Pain Medicine and Neuroimaging  [facility] [address], [place], [place] [address] [phone]

## 2015-11-20 LAB — CD19 AND CD20, FLOW CYTOMETRY
% CD19: NEGATIVE %
% CD20: NEGATIVE %

## 2015-11-20 LAB — CBC WITH DIFFERENTIAL/PLATELET
BASOS: 1 %
Basophils Absolute: 0 10*3/uL (ref 0.0–0.2)
EOS (ABSOLUTE): 0.1 10*3/uL (ref 0.0–0.4)
Eos: 1 %
Hematocrit: 42.7 % (ref 34.0–46.6)
Hemoglobin: 13.7 g/dL (ref 11.1–15.9)
Immature Grans (Abs): 0 10*3/uL (ref 0.0–0.1)
Immature Granulocytes: 0 %
LYMPHS: 38 %
Lymphocytes Absolute: 1.7 10*3/uL (ref 0.7–3.1)
MCH: 29.1 pg (ref 26.6–33.0)
MCHC: 32.1 g/dL (ref 31.5–35.7)
MCV: 91 fL (ref 79–97)
Monocytes Absolute: 0.3 10*3/uL (ref 0.1–0.9)
Monocytes: 6 %
NEUTROS ABS: 2.5 10*3/uL (ref 1.4–7.0)
NEUTROS PCT: 54 %
PLATELETS: 244 10*3/uL (ref 150–379)
RBC: 4.7 x10E6/uL (ref 3.77–5.28)
RDW: 14.2 % (ref 12.3–15.4)
WBC: 4.5 10*3/uL (ref 3.4–10.8)

## 2015-11-20 LAB — NEUROMYELITIS OPTICA AUTOAB, IGG: NMO IgG Autoantibodies: 3.7 U/mL — ABNORMAL HIGH (ref 0.0–3.0)

## 2015-11-24 ENCOUNTER — Telehealth: Payer: Self-pay | Admitting: *Deleted

## 2015-11-24 NOTE — Telephone Encounter (Signed)
-----   Message from Asa Lenteichard A Sater, MD sent at 11/21/2015  1:48 PM EDT ----- B cell labs are good. So we can do Rituxan at standard 6 months  NMO Ab mildly elevated but not enough to change anything

## 2015-11-24 NOTE — Telephone Encounter (Signed)
LMOM (identified vm) that per RAS, labs are good for Rituxan.  Orders (just for Rituxan 1,000mg , 1 dose only, no repeat dose in 2 weeks) [facility]/fim

## 2016-05-19 ENCOUNTER — Ambulatory Visit: Payer: BC Managed Care – PPO | Admitting: Neurology

## 2016-06-09 ENCOUNTER — Ambulatory Visit (INDEPENDENT_AMBULATORY_CARE_PROVIDER_SITE_OTHER): Payer: BC Managed Care – PPO | Admitting: Neurology

## 2016-06-09 ENCOUNTER — Encounter: Payer: Self-pay | Admitting: Neurology

## 2016-06-09 VITALS — BP 134/86 | HR 74 | Resp 18 | Ht 67.0 in | Wt 257.0 lb

## 2016-06-09 DIAGNOSIS — G360 Neuromyelitis optica [Devic]: Secondary | ICD-10-CM | POA: Diagnosis not present

## 2016-06-09 DIAGNOSIS — Z79899 Other long term (current) drug therapy: Secondary | ICD-10-CM | POA: Diagnosis not present

## 2016-06-09 DIAGNOSIS — F329 Major depressive disorder, single episode, unspecified: Secondary | ICD-10-CM

## 2016-06-09 DIAGNOSIS — R260 Ataxic gait: Secondary | ICD-10-CM | POA: Diagnosis not present

## 2016-06-09 DIAGNOSIS — F32A Depression, unspecified: Secondary | ICD-10-CM

## 2016-06-09 DIAGNOSIS — R292 Abnormal reflex: Secondary | ICD-10-CM | POA: Diagnosis not present

## 2016-06-09 DIAGNOSIS — H469 Unspecified optic neuritis: Secondary | ICD-10-CM | POA: Diagnosis not present

## 2016-06-09 MED ORDER — PHENTERMINE HCL 30 MG PO CAPS: 30.0000 mg | ORAL_CAPSULE | ORAL | 5 refills | Status: DC

## 2016-06-09 MED ORDER — BUPROPION HCL ER (XL) 150 MG PO TB24: 150.0000 mg | ORAL_TABLET | Freq: Every day | ORAL | 11 refills | Status: DC

## 2016-06-09 NOTE — Progress Notes (Signed)
[facility]  PATIENT: Mary Ross DOB: [birth year]   _________________________________   HISTORICAL  CHIEF COMPLAINT:  Chief Complaint  Patient presents with  . Devic's Disease    F/U Devic's Dz. Last Rituxan infusion was 01-29-16.  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  She had Rituxan 1000 mg x 2 in June and X 2 in December 2016 and 1000 mg x 1 January 29, 2016.    Her infusions went well.   She has not noted any new symptoms since starting Rituxan  Gait/strength/sensation:   She feels gait is doing well and denies any recent falls.   She feels strength and sensation is normal in the arms and legs.  She gets painful dysesthesias in her arms and feet intermittently.    With eyes closed she feels she wobbles.     Vision: Although she had a complete recovery of her left visual acuity, she still has occasional blurry vision and colors are desaturated.    Bladder and bowel function are fine.    The mild urgency/frequency have resolved.     Fatigue:   She notes fatigue many days but notes others around her do as well.  Her fatigue gets worse as the day goes on and much worse with heat.    She sleeps well at night.    Phentermine was trid but caused headaches so she stopped.   Sleep is variable and some nights she wakes up a lot.     Cognition is fine.   She stopped Prozac as it did not help much.   Depression is mild  Headaches:  She gets headaches many days.  When more intense, vision gets blurry.   HA build up as day goes on.   She has had rare migraines that are different.  .     Devic's Disease:   She noted visual disturbance on the left in late January 2016.  She had eye pain.  Over a week, vision loss spread from the central region to the periphery and she was only able to see out of a small corner of her visual field out of  her left eye.   Vision was normal out of the right. She saw an optometrist who noted papilitis and ordered an MRI that showed enhancement of the left optic nerve and also showing a small white matter focus in the right parietal periventricular region.   The focus was on diffusion-weighted images but did not show up on flare or ADC.    It did not enhance.   She saw Mary Ross on 08/27/2014 who started a course of IV Solu-Medrol (5 days) and set up a lumbar puncture. Spinal fluid from 09/02/2014 showed no oligoclonal bands and normal IgG index of 0.6. The myelin basic protein was minimally elevated at 2.0. The rest of the studies were normal or negative.  Vision improved but not to baseline.     The ESR, ANA and Ace were normal or negative. The NMO Ab returned positive at 6.3.     REVIEW OF SYSTEMS: Constitutional: No fevers, chills, sweats, or change in appetite.  No fatigue Eyes: as above.  No current eye pain Ear, nose and throat: No hearing loss, ear pain, nasal congestion, sore throat Cardiovascular: No chest pain, palpitations Respiratory: No shortness of breath at rest or  with exertion.   No wheezes GastrointestinaI: No nausea, vomiting, diarrhea, abdominal pain, fecal incontinence Genitourinary: No dysuria or urinary retention.  She reports frequency and nocturia. Musculoskeletal: No neck pain.  Notes back pain Integumentary: No rash, pruritus, skin lesions Neurological: as above Psychiatric: mild depression, better with prozac.  No anxiety Endocrine: No palpitations, diaphoresis, change in appetite, change in weigh or increased thirst Hematologic/Lymphatic: No anemia, purpura, petechiae. Allergic/Immunologic: No itchy/runny eyes, nasal congestion, recent allergic reactions, rashes  ALLERGIES: No Known Allergies  HOME MEDICATIONS:  Current Outpatient Prescriptions:  .  phentermine 30 MG capsule, Take 1 capsule (30 mg total) by mouth every morning., Disp: 30 capsule, Rfl: 5 .   riTUXimab (RITUXAN) 100 MG/10ML injection, Inject 1,000 mg into the vein., Disp: , Rfl:  .  buPROPion (WELLBUTRIN XL) 150 MG 24 hr tablet, Take 1 tablet (150 mg total) by mouth daily., Disp: 30 tablet, Rfl: 11  PAST MEDICAL HISTORY: Past Medical History:  Diagnosis Date  . Depression 09/26/2014  . Headache   . Vision abnormalities     PAST SURGICAL HISTORY: Past Surgical History:  Procedure Laterality Date  . CESAREAN SECTION    . CHOLECYSTECTOMY OPEN      FAMILY HISTORY: Family History  Problem Relation Age of Onset  . High Cholesterol Mother   . Hyperthyroidism Mother   . Hypertension Father     SOCIAL HISTORY:  Social History   Social History  . Marital status: Unknown    Spouse name: N/A  . Number of children: N/A  . Years of education: N/A   Occupational History  . 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 Smoking status: Never Smoker  . Smokeless tobacco: Not on file  . Alcohol use No  . Drug use: No  . Sexual activity: Not on file   Other Topics Concern  . Not on file   Social History Narrative  . No narrative on file     PHYSICAL EXAM  Vitals:   06/09/16 1601  BP: 134/86  Pulse: 74  Resp: 18  Weight: 257 lb (116.6 kg)  Height: 5' 7"  (1.702 m)    Body mass index is 40.25 kg/m.   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 Retinal vessels are normal and symmetric.  Neurologic Exam  Mental status: The patient is alert and oriented x 3 at the time of the examination. The patient has apparent normal recent and remote memory, with an apparently normal attention span and concentration ability.   Speech is normal.  Cranial nerves: Extraocular movements are full. Pupils show a 2+ APD on the right. Color is desaturated out of OS..  th is normal. No dysarthria is noted.  The tongue is midline,  and the patient has symmetric elevation of the soft palate.   Hearing is symmetric.  Motor:  Muscle bulk is normal.   Tone is normal. Strength is  5 / 5 in all 4 extremities. .  Sensory:    She reports normal and symmetric sensation to touch, temperature and vibration.  Coordination: Cerebellar testing reveals good finger-nose-finger bilaterally.  Gait and station: Station is normal.   Gait is normal. Tandem gait is mildly wide.   DTR's:   DTR's are 3+ at knees with mild spread.   2+ at ankles, no clonus.      DIAGNOSTIC  DATA (LABS, IMAGING, TESTING) - I reviewed patient records, labs, notes, testing and imaging myself where available.     ASSESSMENT AND PLAN  Devic's disease ([place]) - Plan: CD19 and CD20, Flow Cytometry, Neuromyelitis optica autoab, IgG  Optic neuritis  Ataxic gait  Depression, unspecified depression type  Hyperreflexia of lower extremity  High risk medication use - Plan: CD19 and CD20, Flow Cytometry, Neuromyelitis optica autoab, IgG   1.  Rituximab therapy 1000 mg in January 2018..     2.   Check CBC, CD19/CD20 and anti-NMO Ab 3.   Bupropion for depression phentermine for weight loss and fatigue. 4.   Continue to be active and exercises as tolerated.  She will return to see this office for her infusion and see me in again about 5-6 months or sooner if she has new or worsening neurologic symptoms.   Mary Ross A. Felecia Shelling, MD, PhD 54/49/2010, 0:71 PM Certified in Neurology, Clinical Neurophysiology, Sleep Medicine, Pain Medicine and Neuroimaging  [facility] [address] [place], [place] [address] [phone]

## 2016-06-16 LAB — CD19 AND CD20, FLOW CYTOMETRY

## 2016-06-16 LAB — NEUROMYELITIS OPTICA AUTOAB, IGG: NMO IgG Autoantibodies: 6.3 U/mL — ABNORMAL HIGH (ref 0.0–3.0)

## 2016-06-18 ENCOUNTER — Telehealth: Payer: Self-pay | Admitting: *Deleted

## 2016-06-18 NOTE — Telephone Encounter (Signed)
-----   Message from Asa Lenteichard A Sater, MD sent at 06/18/2016  8:29 AM EST ----- Please let her know that the NMO antibody little higher than last time. We will continue the Rituxan and re-check the lab at the next visit.   If the antibody moves up further I may need to add a low daily dose of prednisone.

## 2016-06-18 NOTE — Telephone Encounter (Signed)
LMOM (identified vm) that per RAS, NMO is a little higher than last time.  She should continue Rituxan for now.  Will check labs at next ov, and if NMO continues to elevate, he may add po Prednisone (in addition to Rituxan)/fim

## 2016-07-07 ENCOUNTER — Other Ambulatory Visit: Payer: Self-pay | Admitting: *Deleted

## 2016-07-07 ENCOUNTER — Encounter: Payer: Self-pay | Admitting: Neurology

## 2016-07-07 MED ORDER — PHENTERMINE HCL 30 MG PO CAPS: 30.0000 mg | ORAL_CAPSULE | ORAL | 5 refills | Status: DC

## 2016-12-07 ENCOUNTER — Encounter: Payer: Self-pay | Admitting: Neurology

## 2016-12-07 ENCOUNTER — Ambulatory Visit (INDEPENDENT_AMBULATORY_CARE_PROVIDER_SITE_OTHER): Payer: BC Managed Care – PPO | Admitting: Neurology

## 2016-12-07 VITALS — BP 119/83 | HR 112 | Resp 16 | Ht 67.0 in | Wt 235.0 lb

## 2016-12-07 DIAGNOSIS — R208 Other disturbances of skin sensation: Secondary | ICD-10-CM

## 2016-12-07 DIAGNOSIS — H469 Unspecified optic neuritis: Secondary | ICD-10-CM

## 2016-12-07 DIAGNOSIS — G360 Neuromyelitis optica [Devic]: Secondary | ICD-10-CM

## 2016-12-07 DIAGNOSIS — Z79899 Other long term (current) drug therapy: Secondary | ICD-10-CM | POA: Diagnosis not present

## 2016-12-07 DIAGNOSIS — R5383 Other fatigue: Secondary | ICD-10-CM | POA: Diagnosis not present

## 2016-12-07 MED ORDER — BUPROPION HCL ER (XL) 150 MG PO TB24: 150.0000 mg | ORAL_TABLET | Freq: Every day | ORAL | 11 refills | Status: DC

## 2016-12-07 MED ORDER — PHENTERMINE HCL 30 MG PO CAPS
30.0000 mg | ORAL_CAPSULE | ORAL | 5 refills | Status: DC
Start: 2016-12-07 — End: 2017-01-11

## 2016-12-07 NOTE — Progress Notes (Signed)
[facility]  PATIENT: Mary Ross DOB: 12-Aug-1975   _________________________________   HISTORICAL  CHIEF COMPLAINT:  Chief Complaint  Patient presents with  . Devic's Ross    Last Rituxan infusion 01/28/17.  Here today to plan next infusion. Denies new or worsening sx./fim    HISTORY OF PRESENT ILLNESS:  Mary Ross is a 41 yo woman with Devic's Ross.   She feels mostly stable but has had some new dizziness.   The dizziness  comes and goes but is usually unrelated to activity or position.   She may be sitting at work and gets sudden onset of rotational vertigo x several minutes.   Leaning over and getting back up may trigger a spell.     Her Devic's has been manifested mostly by left sided optic neuritis.   She had Rituxan 1000 mg x 2 in June 2016 and X 2 in December 2016 and 1000 mg x 1 January 29, 2016.    Her infusions went well.   She has not noted any new symptoms since starting Rituxan.   She did not get January 2018 infusion due to scheduling or pre-authorization issues and we will try to get her infused as soon as possible    Gait/strength/sensation:   She feels gait is doing well and denies any recent falls.   She feels strength and sensation is normal in the arms and legs.  She gets painful dysesthesias in her arms and feet intermittently.    With eyes closed she feels she wobbles.     Vision: Vision return to 20/20 but she continues to have desaturated colors out of her left eye, worse when she is hot.   Bladder and bowel function are fine.     Fatigue:   She notes fatigue that is physical as well as cognitive.  She works and is raising 3 kids and that tires her out.  She has received a benefit from phentermine.   She had some mild headaches from phentermine but they were not bad enough to stop.    Her fatigue gets worse as the day goes on and much worse with heat.    She sleeps well at night.      Cognition is fine.  Depression is mild and  improved with buproprion.     Headaches:  These are less common this year than last year.   .  Mary Ross:   She noted visual disturbance on the left in late January 2016.  She had eye pain.  Over a week, vision loss spread from the central region to the periphery and she was only able to see out of a small corner of her visual field out of her left eye.   Vision was normal out of the right. She saw an optometrist who noted papilitis and ordered an MRI that showed enhancement of the left optic nerve and also showing a small white matter focus in the right parietal periventricular region.   The focus was on diffusion-weighted images but did not show up on flare or ADC.    It did not enhance.   She saw Dr. Metta Clines on 08/27/2014 who started a course of IV Solu-Medrol (5 days) and set up a lumbar puncture. Spinal fluid from 09/02/2014 showed no oligoclonal bands and normal IgG index of 0.6. The myelin basic protein was minimally elevated at 2.0. The rest of the studies were normal or negative.  Vision improved but not  to baseline.     The ESR, ANA and Ace were normal or negative. The NMO Ab returned positive at 6.3.     REVIEW OF SYSTEMS: Constitutional: No fevers, chills, sweats, or change in appetite.  No fatigue Eyes: as above.  No current eye pain Ear, nose and throat: No hearing loss, ear pain, nasal congestion, sore throat Cardiovascular: No chest pain, palpitations Respiratory: No shortness of breath at rest or with exertion.   No wheezes GastrointestinaI: No nausea, vomiting, diarrhea, abdominal pain, fecal incontinence Genitourinary: No dysuria or urinary retention.  She reports frequency and nocturia. Musculoskeletal: No neck pain.  Notes back pain Integumentary: No rash, pruritus, skin lesions Neurological: as above Psychiatric: mild depression, better with prozac.  No anxiety Endocrine: No palpitations, diaphoresis, change in appetite, change in weigh or increased  thirst Hematologic/Lymphatic: No anemia, purpura, petechiae. Allergic/Immunologic: No itchy/runny eyes, nasal congestion, recent allergic reactions, rashes  ALLERGIES: No Known Allergies  HOME MEDICATIONS:  Current Outpatient Prescriptions:  .  buPROPion (WELLBUTRIN XL) 150 MG 24 hr tablet, Take 1 tablet (150 mg total) by mouth daily., Disp: 30 tablet, Rfl: 11 .  phentermine 30 MG capsule, Take 1 capsule (30 mg total) by mouth every morning., Disp: 30 capsule, Rfl: 5 .  riTUXimab (RITUXAN) 100 MG/10ML injection, Inject 1,000 mg into the vein., Disp: , Rfl:   PAST MEDICAL HISTORY: Past Medical History:  Diagnosis Date  . Depression 09/26/2014  . Headache   . Vision abnormalities     PAST SURGICAL HISTORY: Past Surgical History:  Procedure Laterality Date  . CESAREAN SECTION    . CHOLECYSTECTOMY OPEN      FAMILY HISTORY: Family History  Problem Relation Age of Onset  . High Cholesterol Mother   . Hyperthyroidism Mother   . Hypertension Father     SOCIAL HISTORY:  Social History   Social History  . Marital status: Unknown    Spouse name: N/A  . Number of children: N/A  . 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 Smoking status: Never Smoker  . Smokeless tobacco: Never Used  . Alcohol use No  . Drug use: No  . Sexual activity: Not on file   Other Topics Concern  . Not on file   Social History Narrative  . No narrative on file     PHYSICAL EXAM  Vitals:   12/07/16 1604  BP: 119/83  Pulse: (!) 112  Resp: 16  Weight: 235 lb (106.6 kg)  Height: _0  (1.702 m)    Body mass index is 36.81 kg/m.   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 Retinal vessels are normal and symmetric.  Neurologic Exam  Mental status: The patient is alert and  oriented x 3 at the time of the examination. The patient has apparent normal recent and remote memory, with an apparently normal attention span and concentration ability.   Speech is normal.  Cranial nerves: Extraocular movements are full. Pupils show a 2+ APD on the right. Color is desaturated out of OS..  th is normal. No dysarthria is noted.  The tongue is midline, and the patient has symmetric elevation of the soft palate.   Hearing is symmetric.  Motor:  Muscle bulk is normal.   Tone is normal. Strength is  5 / 5 in all 4 extremities. .  Sensory:    She reports normal and symmetric sensation to touch, temperature and vibration.  Coordination: Cerebellar testing reveals good finger-nose-finger bilaterally.  Gait and station: Station is normal.   Gait is normal. Tandem gait is mildly wide.   DTR's:   DTR's are 3+ at knees with mild spread.   2+ at ankles, no clonus.      DIAGNOSTIC DATA (LABS, IMAGING, TESTING) - I reviewed patient records, labs, notes, testing and imaging myself where available.     ASSESSMENT AND PLAN  Devic's Ross ([place]) - Plan: Neuromyelitis Optica AQP4 Auto Ab, CBC with Differential/Platelet, Hepatic function panel  Optic neuritis - Plan: Neuromyelitis Optica AQP4 Auto Ab, CBC with Differential/Platelet, Hepatic function panel  High risk medication use - Plan: Neuromyelitis Optica AQP4 Auto Ab, CBC with Differential/Platelet, Hepatic function panel  Other fatigue  Dysesthesia   1.   She will continue with the Rituximab therapy 1000 mg,   we will try to get her next doses as possible per   2.   Check CBC and anti-NMO Ab 3.   Continue Wellbutrin for depression. Continue phentermine for weight loss and fatigue.  4.   Remain active and exercises as tolerated.jn n 5.   Return to clinic for next infusion and in about 5-6 months or sooner if she has new or worsening neurologic symptoms.   Hera Celaya A. Felecia Shelling, MD, PhD 03/08/6750, 9:82 PM Certified in  Neurology, Clinical Neurophysiology, Sleep Medicine, Pain Medicine and Neuroimaging  [facility] [address] [place], [place] [address] [phone]

## 2016-12-08 LAB — HEPATIC FUNCTION PANEL
ALBUMIN: 4.4 g/dL (ref 3.5–5.5)
ALT: 16 IU/L (ref 0–32)
AST: 20 IU/L (ref 0–40)
Alkaline Phosphatase: 68 IU/L (ref 39–117)
BILIRUBIN TOTAL: 0.4 mg/dL (ref 0.0–1.2)
BILIRUBIN, DIRECT: 0.13 mg/dL (ref 0.00–0.40)
Total Protein: 6.7 g/dL (ref 6.0–8.5)

## 2016-12-08 LAB — CBC WITH DIFFERENTIAL/PLATELET
BASOS: 0 %
Basophils Absolute: 0 10*3/uL (ref 0.0–0.2)
EOS (ABSOLUTE): 0.1 10*3/uL (ref 0.0–0.4)
EOS: 1 %
HEMATOCRIT: 38.6 % (ref 34.0–46.6)
HEMOGLOBIN: 13 g/dL (ref 11.1–15.9)
IMMATURE GRANS (ABS): 0 10*3/uL (ref 0.0–0.1)
IMMATURE GRANULOCYTES: 0 %
LYMPHS: 21 %
Lymphocytes Absolute: 1.6 10*3/uL (ref 0.7–3.1)
MCH: 28.6 pg (ref 26.6–33.0)
MCHC: 33.7 g/dL (ref 31.5–35.7)
MCV: 85 fL (ref 79–97)
MONOCYTES: 6 %
Monocytes Absolute: 0.4 10*3/uL (ref 0.1–0.9)
NEUTROS PCT: 72 %
Neutrophils Absolute: 5.8 10*3/uL (ref 1.4–7.0)
Platelets: 219 10*3/uL (ref 150–379)
RBC: 4.55 x10E6/uL (ref 3.77–5.28)
RDW: 13.7 % (ref 12.3–15.4)
WBC: 7.9 10*3/uL (ref 3.4–10.8)

## 2016-12-08 LAB — NEUROMYELITIS OPTICA AUTOAB, IGG: NMO IgG Autoantibodies: 1.6 U/mL (ref 0.0–3.0)

## 2016-12-09 ENCOUNTER — Telehealth: Payer: Self-pay | Admitting: *Deleted

## 2016-12-09 NOTE — Telephone Encounter (Signed)
-----   Message from Asa Lenteichard A Sater, MD sent at 12/09/2016 12:55 PM EDT ----- Please let the patient know that the lab work is fine.

## 2016-12-09 NOTE — Telephone Encounter (Signed)
LMOM that per RAS, lab work done in our office is fine.  She does not need to return this call unless she has questions/fim 

## 2016-12-15 ENCOUNTER — Encounter: Payer: Self-pay | Admitting: Neurology

## 2017-01-11 ENCOUNTER — Telehealth: Payer: Self-pay | Admitting: *Deleted

## 2017-01-11 ENCOUNTER — Encounter: Payer: Self-pay | Admitting: Neurology

## 2017-01-11 MED ORDER — PHENTERMINE HCL 30 MG PO CAPS: 30.0000 mg | ORAL_CAPSULE | ORAL | 5 refills | Status: DC

## 2017-01-11 NOTE — Telephone Encounter (Signed)
Pt. lost rx. that was printed at last ov.  Per RAS, ok to replace.  Rx. faxed to [facility]./fim

## 2017-03-10 ENCOUNTER — Telehealth: Payer: Self-pay | Admitting: *Deleted

## 2017-03-10 NOTE — Telephone Encounter (Signed)
LMOM (identifed vm) that the infusion suite has had some difficulty contacting her to schedule her Rituxan infusion, and asked that she contact Inetta Fermoina or Mindy at [phone] at her earliest convenience to get this scheduled/fim

## 2017-06-14 ENCOUNTER — Ambulatory Visit: Payer: BC Managed Care – PPO | Admitting: Neurology

## 2017-07-13 ENCOUNTER — Other Ambulatory Visit: Payer: Self-pay | Admitting: Neurology

## 2017-07-22 ENCOUNTER — Encounter: Payer: Self-pay | Admitting: Neurology

## 2017-08-19 ENCOUNTER — Other Ambulatory Visit: Payer: Self-pay

## 2017-08-19 ENCOUNTER — Ambulatory Visit: Payer: BC Managed Care – PPO | Admitting: Neurology

## 2017-08-19 ENCOUNTER — Encounter: Payer: Self-pay | Admitting: Neurology

## 2017-08-19 VITALS — BP 113/81 | HR 87 | Resp 16 | Ht 67.0 in | Wt 243.5 lb

## 2017-08-19 DIAGNOSIS — F329 Major depressive disorder, single episode, unspecified: Secondary | ICD-10-CM | POA: Diagnosis not present

## 2017-08-19 DIAGNOSIS — H469 Unspecified optic neuritis: Secondary | ICD-10-CM | POA: Diagnosis not present

## 2017-08-19 DIAGNOSIS — Z79899 Other long term (current) drug therapy: Secondary | ICD-10-CM

## 2017-08-19 DIAGNOSIS — G360 Neuromyelitis optica [Devic]: Secondary | ICD-10-CM

## 2017-08-19 DIAGNOSIS — F32A Depression, unspecified: Secondary | ICD-10-CM

## 2017-08-19 DIAGNOSIS — R5383 Other fatigue: Secondary | ICD-10-CM

## 2017-08-19 MED ORDER — PHENTERMINE HCL 37.5 MG PO CAPS: 37.5000 mg | ORAL_CAPSULE | ORAL | 5 refills | Status: DC

## 2017-08-19 MED ORDER — BUPROPION HCL ER (XL) 150 MG PO TB24: 150.0000 mg | ORAL_TABLET | Freq: Every day | ORAL | 11 refills | Status: DC

## 2017-08-19 NOTE — Progress Notes (Addendum)
[facility]  PATIENT: Mary Ross DOB: 06/09/1976   _________________________________   HISTORICAL  CHIEF COMPLAINT:  Chief Complaint  Patient presents with  . Devic's Ross    Last Rituxan infusions were on 03/28/17 and 04/11/17.  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     Update 08/19/2017: She feels that she has been stable with no new exacerbation. She is tolerating Rituxan well. If infusions have gone without any reactions.    Her last infusions were about 4 months ago. 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 However, she does feel a little clumsy and has had a wide tandem gait.  She also feels unsteady when she closes her eyes. He also gets some painful dysesthesias at times.  She continues to report some fatigue.   Phentermine has helped her fatigue and helped stabilize her weight.  She feels her mood has done very well on Wellbutrin. She tolerates it well.  From 12/07/2016: She feels mostly stable but has had some new dizziness.   The dizziness  comes and goes but is usually unrelated to activity or position.   She may be sitting at work and gets sudden onset of rotational vertigo x several minutes.   Leaning over and getting back up may trigger a spell.     Her Devic's has been manifested mostly by left sided optic neuritis.   She had Rituxan 1000 mg x 2 in June 2016 and X 2 in December 2016 and 1000 mg x 1 January 29, 2016.    Her infusions went well.   She has not noted any new symptoms since starting Rituxan.   She did not get January 2018 infusion due to scheduling or pre-authorization issues and we will try to get her infused as soon as possible    Gait/strength/sensation:   She feels gait is doing well and denies any recent falls.   She feels strength and sensation is normal in the arms and  legs.  She gets painful dysesthesias in her arms and feet intermittently.    With eyes closed she feels she wobbles.     Vision: Vision return to 20/20 but she continues to have desaturated colors out of her left eye, worse when she is hot.   Bladder and bowel function are fine.     Fatigue:   She notes fatigue that is physical as well as cognitive.  She works and is raising 3 kids and that tires her out.  She has received a benefit from phentermine.   She had some mild headaches from phentermine but they were not bad enough to stop.    Her fatigue gets worse as the day goes on and much worse with heat.    She sleeps well at night.      Cognition is fine.  Depression is mild and improved with buproprion.     Headaches:  These are less common this year than last year.   .  Mary Ross:   She noted visual disturbance on the left in late January 2016.  She had eye pain.  Over a week, vision loss spread from the central region to the periphery and she was only able to see out of a small corner of her visual field out of her left eye.  Vision was normal out of the right. She saw an optometrist who noted papilitis and ordered an MRI that showed enhancement of the left optic nerve and also showing a small white matter focus in the right parietal periventricular region.   The focus was on diffusion-weighted images but did not show up on flare or ADC.    It did not enhance.   She saw Dr. Metta Clines on 08/27/2014 who started a course of IV Solu-Medrol (5 days) and set up a lumbar puncture. Spinal fluid from 09/02/2014 showed no oligoclonal bands and normal IgG index of 0.6. The myelin basic protein was minimally elevated at 2.0. The rest of the studies were normal or negative.  Vision improved but not to baseline.     The ESR, ANA and Ace were normal or negative. The NMO Ab returned positive at 6.3.     REVIEW OF SYSTEMS: Constitutional: No fevers, chills, sweats, or change in appetite.  No  fatigue Eyes: as above.  No current eye pain Ear, nose and throat: No hearing loss, ear pain, nasal congestion, sore throat Cardiovascular: No chest pain, palpitations Respiratory: No shortness of breath at rest or with exertion.   No wheezes GastrointestinaI: No nausea, vomiting, diarrhea, abdominal pain, fecal incontinence Genitourinary: No dysuria or urinary retention.  She reports frequency and nocturia. Musculoskeletal: No neck pain.  Notes back pain Integumentary: No rash, pruritus, skin lesions Neurological: as above Psychiatric: mild depression, better with prozac.  No anxiety Endocrine: No palpitations, diaphoresis, change in appetite, change in weigh or increased thirst Hematologic/Lymphatic: No anemia, purpura, petechiae. Allergic/Immunologic: No itchy/runny eyes, nasal congestion, recent allergic reactions, rashes  ALLERGIES: No Known Allergies  HOME MEDICATIONS:  Current Outpatient Medications:  .  buPROPion (WELLBUTRIN XL) 150 MG 24 hr tablet, Take 1 tablet (150 mg total) by mouth daily., Disp: 30 tablet, Rfl: 11 .  phentermine 30 MG capsule, TAKE ONE CAPSULE BY MOUTH EVERY MORNING, Disp: 30 capsule, Rfl: 5 .  riTUXimab (RITUXAN) 100 MG/10ML injection, Inject 1,000 mg into the vein., Disp: , Rfl:  .  phentermine 37.5 MG capsule, Take 1 capsule (37.5 mg total) by mouth every morning., Disp: 30 capsule, Rfl: 5  PAST MEDICAL HISTORY: Past Medical History:  Diagnosis Date  . Depression 09/26/2014  . Headache   . Vision abnormalities     PAST SURGICAL HISTORY: Past Surgical History:  Procedure Laterality Date  . CESAREAN SECTION    . CHOLECYSTECTOMY OPEN      FAMILY HISTORY: Family History  Problem Relation Age of Onset  . High Cholesterol Mother   . Hyperthyroidism Mother   . Hypertension Father     SOCIAL HISTORY:  Social History   Socioeconomic History  . Marital status: Unknown    Spouse name: Not on file  . Number of children: Not on file  .  Years of education: Not on file  . Highest education level: Not on file  Social Needs  . Financial resource strain: Not on file  . Food insecurity - worry: Not on file  . Food insecurity - inability: Not on file  . Transportation needs - medical: Not on file  . Transportation needs - non-medical: Not on file  Occupational History  . Occupation: Health Educator    Comment: [employer]  Tobacco Use  . Smoking status: Never Smoker  . Smokeless tobacco: Never Used  Substance and Sexual Activity  . Alcohol use: No    Alcohol/week: 0.0 oz  . Drug use: No  .  Sexual activity: Not on file  Other Topics Concern  . Not on file  Social History Narrative  . Not on file     PHYSICAL EXAM  Vitals:   08/19/17 0901  BP: 113/81  Pulse: 87  Resp: 16  Weight: 243 lb 8 oz (110.5 kg)  Height: 5' 7"  (1.702 m)    Body mass index is 38.14 kg/m.   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 Retinal vessels are normal and symmetric.  Neurologic Exam  Mental status: The patient is alert and oriented x 3 at the time of the examination. The patient has apparent normal recent and remote memory, with an apparently normal attention span and concentration ability.   Speech is normal.  Cranial nerves: Extraocular movements are full. Pupils show a 2+ APD on the left. Color is desaturated out of OS..  th is normal. No dysarthria is noted.  The tongue is midline, and the patient has symmetric elevation of the soft palate.   Hearing is symmetric.  Motor:  Muscle bulk is normal.   Tone is normal. Strength is  5 / 5 in all 4 extremities. .  Sensory:    She has intact sensation to touch and vibration.  Coordination: Cerebellar testing reveals good finger-nose-finger bilaterally.  Gait and station: Station is normal.   Gait is normal. The tandem gait is mildly wide. Romberg is  borderline. DTR's:   DTR's are 3+ at knees with mild spread.   2+ at ankles, no clonus.      DIAGNOSTIC DATA (LABS, IMAGING, TESTING) - I reviewed patient records, labs, notes, testing and imaging myself where available.     ASSESSMENT AND PLAN  Devic's Ross ([place]) - Plan: IgG, IgA, IgM, CBC with Differential/Platelet, Neuromyelitis optica autoab, IgG  Optic neuritis  High risk medication use - Plan: IgG, IgA, IgM, CBC with Differential/Platelet, Neuromyelitis optica autoab, IgG  Other fatigue  Depression, unspecified depression type   1.   We will continue rituximab 1000 mg every 6 months   2.   Check CBC, IgG/M/A and anti-NMO Ab 3.   Renew phentermine for fatigue and weight loss. Continue Wellbutrin..  4.   Remain active and exercises as tolerated.jn n 5.   Return to clinic for next infusion and in about 5-6 months or sooner if she has new or worsening neurologic symptoms.   Acelynn Dejonge A. Felecia Shelling, MD, PhD 09/17/1015, 5:10 AM Certified in Neurology, Clinical Neurophysiology, Sleep Medicine, Pain Medicine and Neuroimaging  [facility] [address] [place], [place] [address] [phone]

## 2017-08-22 LAB — CBC WITH DIFFERENTIAL/PLATELET
BASOS ABS: 0 10*3/uL (ref 0.0–0.2)
Basos: 0 %
EOS (ABSOLUTE): 0.1 10*3/uL (ref 0.0–0.4)
Eos: 2 %
Hematocrit: 40.1 % (ref 34.0–46.6)
Hemoglobin: 13.6 g/dL (ref 11.1–15.9)
Immature Grans (Abs): 0 10*3/uL (ref 0.0–0.1)
Immature Granulocytes: 0 %
LYMPHS ABS: 1.5 10*3/uL (ref 0.7–3.1)
Lymphs: 30 %
MCH: 29.1 pg (ref 26.6–33.0)
MCHC: 33.9 g/dL (ref 31.5–35.7)
MCV: 86 fL (ref 79–97)
Monocytes Absolute: 0.3 10*3/uL (ref 0.1–0.9)
Monocytes: 6 %
NEUTROS ABS: 3.1 10*3/uL (ref 1.4–7.0)
Neutrophils: 62 %
PLATELETS: 248 10*3/uL (ref 150–379)
RBC: 4.68 x10E6/uL (ref 3.77–5.28)
RDW: 14 % (ref 12.3–15.4)
WBC: 4.9 10*3/uL (ref 3.4–10.8)

## 2017-08-22 LAB — IGG, IGA, IGM
IGA/IMMUNOGLOBULIN A, SERUM: 119 mg/dL (ref 87–352)
IGM (IMMUNOGLOBULIN M), SRM: 122 mg/dL (ref 26–217)
IgG (Immunoglobin G), Serum: 999 mg/dL (ref 700–1600)

## 2017-08-22 LAB — NEUROMYELITIS OPTICA AUTOAB, IGG: NMO IgG Autoantibodies: 2.8 U/mL (ref 0.0–3.0)

## 2017-08-24 ENCOUNTER — Telehealth: Payer: Self-pay | Admitting: Neurology

## 2017-08-24 NOTE — Telephone Encounter (Signed)
-----   Message from Asa Lenteichard A Sater, MD sent at 08/23/2017  5:09 PM EST ----- Please let the patient know that the lab work is fine. m

## 2017-08-24 NOTE — Telephone Encounter (Signed)
Called the patient to review lab work with her. No answer. LVM per DPR and stated that lab work was fine. Informed the patient if she had any questions to call back.

## 2018-03-21 MED ORDER — PHENTERMINE HCL 37.5 MG PO CAPS: 37.5000 mg | ORAL_CAPSULE | ORAL | 5 refills | Status: DC

## 2018-04-24 ENCOUNTER — Ambulatory Visit (INDEPENDENT_AMBULATORY_CARE_PROVIDER_SITE_OTHER): Payer: BC Managed Care – PPO | Admitting: Neurology

## 2018-04-24 ENCOUNTER — Other Ambulatory Visit: Payer: Self-pay

## 2018-04-24 ENCOUNTER — Encounter: Payer: Self-pay | Admitting: Neurology

## 2018-04-24 VITALS — BP 133/93 | HR 92 | Resp 18 | Ht 67.0 in | Wt 243.0 lb

## 2018-04-24 DIAGNOSIS — R260 Ataxic gait: Secondary | ICD-10-CM

## 2018-04-24 DIAGNOSIS — G360 Neuromyelitis optica [Devic]: Secondary | ICD-10-CM | POA: Diagnosis not present

## 2018-04-24 DIAGNOSIS — H469 Unspecified optic neuritis: Secondary | ICD-10-CM

## 2018-04-24 DIAGNOSIS — R5383 Other fatigue: Secondary | ICD-10-CM

## 2018-04-24 DIAGNOSIS — Z79899 Other long term (current) drug therapy: Secondary | ICD-10-CM

## 2018-04-24 NOTE — Progress Notes (Signed)
[facility]  PATIENT: Mary Ross DOB: 05/03/1976   _________________________________   HISTORICAL  CHIEF COMPLAINT:  Chief Complaint  Patient presents with  . Devic's Disease    Last Rituxan inf. was 09/14/17.  She missed her 03/15/18 infusion, but will r/s today.  Here today with visual changes left eye onset several days ago. Sts. vision is more dull from left eye and she has black spots in vision/fim    HISTORY OF PRESENT ILLNESS:  Mary Ross is a 42 y.o. woman with Devic's disease and recurrent optic neuritis.    Update 04/24/2018: She had the onset of floaters in her vision on the left.   She also notes left color desaturation and reduced VA.   VA is 20/40 OD and 20/50 OS She also has left eye pain and headache, worse when she moves her eyes.     Her gait is slightly off balanced but no worse recently.     She has no numbness or weakness.    Bladder is fine.  She has been on Rituxan initially with split dose of 1000 mg 2 weeks apart every 6 months and then 1000 mg every 6 months she is several weeks late due to personal scheduling issues  She reports mild depression that has improved with Wellbutrin.   She has some fatigue.  Update 08/19/2017: She feels that she has been stable with no new exacerbation. She is tolerating Rituxan well. If infusions have gone without any reactions.    Her last infusions were about 4 months ago. She was diagnosed with Devic disease based on recurrent optic neuritis (left 2) she did not have evidence of spinal disease. However, she does feel a little clumsy and has had a wide tandem gait.  She also feels unsteady when she closes her eyes. He also gets some painful dysesthesias at times.  She continues to report some fatigue.   Phentermine has helped her fatigue and helped stabilize her weight.  She feels her mood has done very well on Wellbutrin. She tolerates it well.  From 12/07/2016: She feels mostly stable but has had  some new dizziness.   The dizziness  comes and goes but is usually unrelated to activity or position.   She may be sitting at work and gets sudden onset of rotational vertigo x several minutes.   Leaning over and getting back up may trigger a spell.     Her Devic's has been manifested mostly by left sided optic neuritis.   She had Rituxan 1000 mg x 2 in June 2016 and X 2 in December 2016 and 1000 mg x 1 January 29, 2016.    Her infusions went well.   She has not noted any new symptoms since starting Rituxan.   She did not get January 2018 infusion due to scheduling or pre-authorization issues and we will try to get her infused as soon as possible    Gait/strength/sensation:   She feels gait is doing well and denies any recent falls.   She feels strength and sensation is normal in the arms and legs.  She gets painful dysesthesias in her arms and feet intermittently.    With eyes closed she feels she wobbles.     Vision: Vision return to 20/20 but she continues to have desaturated colors out of her left eye, worse when she is hot.   Bladder and bowel function are fine.     Fatigue:   She notes fatigue that is physical  as well as cognitive.  She works and is raising 3 kids and that tires her out.  She has received a benefit from phentermine.   She had some mild headaches from phentermine but they were not bad enough to stop.    Her fatigue gets worse as the day goes on and much worse with heat.    She sleeps well at night.      Cognition is fine.  Depression is mild and improved with buproprion.     Headaches:  These are less common this year than last year.   .  Deirdre Peer Disease:   She noted visual disturbance on the left in late January 2016.  She had eye pain.  Over a week, vision loss spread from the central region to the periphery and she was only able to see out of a small corner of her visual field out of her left eye.   Vision was normal out of the right. She saw an optometrist who noted  papilitis and ordered an MRI that showed enhancement of the left optic nerve and also showing a small white matter focus in the right parietal periventricular region.   The focus was on diffusion-weighted images but did not show up on flare or ADC.    It did not enhance.   She saw Dr. Metta Clines on 08/27/2014 who started a course of IV Solu-Medrol (5 days) and set up a lumbar puncture. Spinal fluid from 09/02/2014 showed no oligoclonal bands and normal IgG index of 0.6. The myelin basic protein was minimally elevated at 2.0. The rest of the studies were normal or negative.  Vision improved but not to baseline.     The ESR, ANA and Ace were normal or negative. The NMO Ab returned positive at 6.3.     REVIEW OF SYSTEMS: Constitutional: No fevers, chills, sweats, or change in appetite.  No fatigue Eyes: as above.  No current eye pain Ear, nose and throat: No hearing loss, ear pain, nasal congestion, sore throat Cardiovascular: No chest pain, palpitations Respiratory: No shortness of breath at rest or with exertion.   No wheezes GastrointestinaI: No nausea, vomiting, diarrhea, abdominal pain, fecal incontinence Genitourinary: No dysuria or urinary retention.  She reports frequency and nocturia. Musculoskeletal: No neck pain.  Notes back pain Integumentary: No rash, pruritus, skin lesions Neurological: as above Psychiatric: mild depression, better with prozac.  No anxiety Endocrine: No palpitations, diaphoresis, change in appetite, change in weigh or increased thirst Hematologic/Lymphatic: No anemia, purpura, petechiae. Allergic/Immunologic: No itchy/runny eyes, nasal congestion, recent allergic reactions, rashes  ALLERGIES: No Known Allergies  HOME MEDICATIONS:  Current Outpatient Medications:  .  buPROPion (WELLBUTRIN XL) 150 MG 24 hr tablet, Take 1 tablet (150 mg total) by mouth daily., Disp: 30 tablet, Rfl: 11 .  phentermine 37.5 MG capsule, Take 1 capsule (37.5 mg total) by mouth every  morning., Disp: 30 capsule, Rfl: 5 .  riTUXimab (RITUXAN) 100 MG/10ML injection, Inject 1,000 mg into the vein., Disp: , Rfl:   PAST MEDICAL HISTORY: Past Medical History:  Diagnosis Date  . Depression 09/26/2014  . Headache   . Vision abnormalities     PAST SURGICAL HISTORY: Past Surgical History:  Procedure Laterality Date  . CESAREAN SECTION    . CHOLECYSTECTOMY OPEN      FAMILY HISTORY: Family History  Problem Relation Age of Onset  . High Cholesterol Mother   . Hyperthyroidism Mother   . Hypertension Father  SOCIAL HISTORY:  Social History   Socioeconomic History  . Marital status: Unknown    Spouse name: Not on file  . Number of children: Not on file  . Years of education: Not on file  . Highest education level: Not on file  Occupational History  . Occupation: Musician    Comment: [employer]  Social Needs  . Financial resource strain: Not on file  . Food insecurity:    Worry: Not on file    Inability: Not on file  . Transportation needs:    Medical: Not on file    Non-medical: Not on file  Tobacco Use  . Smoking status: Never Smoker  . Smokeless tobacco: Never Used  Substance and Sexual Activity  . Alcohol use: No    Alcohol/week: 0.0 standard drinks  . Drug use: No  . Sexual activity: Not on file  Lifestyle  . Physical activity:    Days per week: Not on file    Minutes per session: Not on file  . Stress: Not on file  Relationships  . Social connections:    Talks on phone: Not on file    Gets together: Not on file    Attends religious service: Not on file    Active member of club or organization: Not on file    Attends meetings of clubs or organizations: Not on file    Relationship status: Not on file  . Intimate partner violence:    Fear of current or ex partner: Not on file    Emotionally abused: Not on file    Physically abused: Not on file    Forced sexual activity: Not on file  Other Topics Concern  .  Not on file  Social History Narrative  . Not on file     PHYSICAL EXAM  Vitals:   04/24/18 1317  BP: (!) 133/93  Pulse: 92  Resp: 18  Weight: 243 lb (110.2 kg)  Height: _0  (1.702 m)    Body mass index is 38.06 kg/m.   General: The patient is well-developed and well-nourished and in no acute distress  Eye:   Visual acuity is 20/40 and OD 20/50 OS.  Funduscopic examination shows mild left optic atrophy and some blurring of the disc margin temporally. Retinal vessels are normal and symmetric.  Neurologic Exam  Mental status: The patient is alert and oriented x 3 at the time of the examination. The patient has apparent normal recent and remote memory, with an apparently normal attention span and concentration ability.   Speech is normal.  Cranial nerves: Extraocular movements are full. Pupils show a 2+ APD on the left. Color is desaturated out of OS... No dysarthria is noted.  The tongue is midline, and the patient has symmetric elevation of the soft palate.   Hearing is symmetric.  Motor:  Muscle bulk is normal.   Tone is normal. Strength is  5 / 5 in all 4 extremities. .  Sensory:    She has intact sensation to touch and vibration.  Coordination: Cerebellar testing reveals good finger-nose-finger bilaterally.  Gait and station: Station is normal.   Gait is normal. The tandem gait is mildly wide. Romberg is borderline.   DTR's:   DTR's are 3+ at knees with mild spread.   2+ at ankles, no clonus.      DIAGNOSTIC DATA (LABS, IMAGING, TESTING) - I reviewed patient records, labs, notes, testing and imaging myself where available.     ASSESSMENT  AND PLAN  Devic's disease ([place]) - Plan: Neuromyelitis optica autoab, IgG, CD19 and CD20, Flow Cytometry  Optic neuritis  Ataxic gait  High risk medication use - Plan: Neuromyelitis optica autoab, IgG, CD19 and CD20, Flow Cytometry  Other fatigue   1.   3 days of IV Solu-Medrol.  If symptoms worsen she should see  ophthalmology.   2.   We will continue rituximab 1000 mg every 6 months or more frequent based on results of lab tests 2.   Check CBC, CD19/CD20 and anti-NMO Ab 3.   Continue phentermine for fatigue and weight loss. Continue Wellbutrin..  4.   Remain active and exercises as tolerated.jn n 5.   Return to clinic for next infusion and in about 5-6 months or sooner if she has new or worsening neurologic symptoms.   Michial Disney A. Felecia Shelling, MD, PhD 44/96/7591, 6:38 PM Certified in Neurology, Clinical Neurophysiology, Sleep Medicine, Pain Medicine and Neuroimaging  [facility] [address] [place], [place] [address] [phone]

## 2018-04-27 LAB — NEUROMYELITIS OPTICA AUTOAB, IGG: NMO IgG Autoantibodies: 4.7 U/mL — ABNORMAL HIGH (ref 0.0–3.0)

## 2018-04-27 LAB — CD19 AND CD20, FLOW CYTOMETRY
% CD19: NEGATIVE %
% CD20: NEGATIVE %

## 2018-04-28 ENCOUNTER — Telehealth: Payer: Self-pay | Admitting: *Deleted

## 2018-04-28 NOTE — Telephone Encounter (Signed)
-----   Message from Asa Lente, MD sent at 04/27/2018  5:31 PM EDT ----- The B-cell count was 0 which is what we hope to see on Rituxan.  The NMO antibody was slightly elevated at 4.7.  It was also elevated in the past.

## 2018-04-28 NOTE — Telephone Encounter (Signed)
Patient aware of results and will keep pending appointments. 

## 2018-05-19 ENCOUNTER — Encounter: Payer: Self-pay | Admitting: *Deleted

## 2018-10-04 ENCOUNTER — Other Ambulatory Visit: Payer: Self-pay | Admitting: Neurology

## 2018-10-24 ENCOUNTER — Telehealth: Payer: Self-pay | Admitting: Neurology

## 2018-10-24 NOTE — Telephone Encounter (Signed)
I spoke to the patient and informed her that I was sending her the email in the next few minutes and informed her to download the app called [employer] webex on the device she is going to used.

## 2018-10-24 NOTE — Telephone Encounter (Signed)
I called pt and updated her pharmacy list, allergies and med list on file for virtual visit with Dr. Epimenio Foot 10/25/18.  Her last Rituxan infusion: 04/2018. She is scheduled for next infusion 10/30/18. She is going to discuss this tomorrow with Dr. Epimenio Foot to see if she should still proceed with getting infusion d/t concerns w/ covid-19.

## 2018-10-24 NOTE — Telephone Encounter (Signed)
Pt called and consented to a Virtual Visit and for the insurance to be billed as such. E-mail has been confirmed. °

## 2018-10-25 ENCOUNTER — Encounter: Payer: Self-pay | Admitting: Neurology

## 2018-10-25 ENCOUNTER — Other Ambulatory Visit: Payer: Self-pay

## 2018-10-25 ENCOUNTER — Telehealth: Payer: Self-pay | Admitting: Neurology

## 2018-10-25 ENCOUNTER — Ambulatory Visit (INDEPENDENT_AMBULATORY_CARE_PROVIDER_SITE_OTHER): Payer: BC Managed Care – PPO | Admitting: Neurology

## 2018-10-25 DIAGNOSIS — G360 Neuromyelitis optica [Devic]: Secondary | ICD-10-CM | POA: Diagnosis not present

## 2018-10-25 DIAGNOSIS — H469 Unspecified optic neuritis: Secondary | ICD-10-CM | POA: Diagnosis not present

## 2018-10-25 DIAGNOSIS — H3322 Serous retinal detachment, left eye: Secondary | ICD-10-CM | POA: Insufficient documentation

## 2018-10-25 DIAGNOSIS — Z79899 Other long term (current) drug therapy: Secondary | ICD-10-CM | POA: Diagnosis not present

## 2018-10-25 NOTE — Telephone Encounter (Signed)
10/25/18 - LVM to schedule 6 mo f/u for Dr. Epimenio Foot

## 2018-10-25 NOTE — Progress Notes (Signed)
[facility]  PATIENT: Mary Ross DOB: 05-11-1976   _________________________________   HISTORICAL  CHIEF COMPLAINT:  Chief Complaint  Patient presents with  . Other    NMO on Rituxan    HISTORY OF PRESENT ILLNESS:  Svetlana Bagby is a 43 y.o. woman with Devic's disease and recurrent optic neuritis.    Update 10/25/2018: Virtual Visit via Video Note I connected with Shawn Stall on 10/25/18 at  2:30 PM EDT by a video enabled telemedicine application and verified that I am speaking with the correct person.  I discussed the limitations of evaluation and management by telemedicine and the availability of in person appointments. The patient expressed understanding and agreed to proceed.  History of Present Illness: Her last Rituxan She feels she is generally doing well with no new symptoms over the lat few months.   She had left sided visual symptoms and has had 4 laser surgery and needs to have another one.    She otherwise does not note neurologic symptoms.  Specifically she has no difficulties with gait or balance.  Strength and sensation are normal.  No dysesthesias.  Bladder function is doing well.  She has fatigue and mild sleepiness at times.  Phentermine has helped her fatigue/sleepiness.   Initially, she noticed some improvement with weight loss but there was no further weight reduction..   She notes some depression but feels she is doing well.  She switched from Wellbutrin to Lexapro and feels she is doing better.    She sleeps well most nights.  She does not have any difficulties with cognition.  Observations/Objective: She is a well-developed well-nourished woman in no acute distress.  Head is normocephalic and atraumatic.  Sclera are anicteric.  Pharynx and tongue appear normal.  Good range of motion in neck and shoulder.  She is alert and fully oriented with fluent speech and attention, knowledge and memory.  Extraocular muscles are intact.  She does not  have ptosis but closes her left eye due to lights bothering her on that side.  Facial strength appeared normal.  Hearing impaired normal.  Palatal elevation and tongue protrusion was midline.  In the arms, she appeared to have normal strength and had good rapid rotating movements and finger-nose-finger.  Assessment and Plan: Devic's disease ([place])  Optic neuritis  Left retinal detachment  High risk medication use  1.   Continue Rituxan.  We discussed the pros and cons of staying on Rituxan with COVID-19.  She would like to delay her next dose by a month. 2.   She will continue Lexapro. 3.   Follow-up with me in 6 months or sooner if there are new or worsening neurologic symptoms.  Follow Up Instructions: I discussed the assessment and treatment plan with the patient. The patient was provided an opportunity to ask questions and all were answered. The patient agreed with the plan and demonstrated an understanding of the instructions.    The patient was advised to call back or seek an in-person evaluation if the symptoms worsen or if the condition fails to improve as anticipated.  I provided 25 minutes of non-face-to-face time during this encounter. _________________ From previous notes Update 04/24/2018: She had the onset of floaters in her vision on the left.   She also notes left color desaturation and reduced VA.   VA is 20/40 OD and 20/50 OS She also has left eye pain and headache, worse when she moves her eyes.     Her gait is  slightly off balanced but no worse recently.     She has no numbness or weakness.    Bladder is fine.  She has been on Rituxan initially with split dose of 1000 mg 2 weeks apart every 6 months and then 1000 mg every 6 months she is several weeks late due to personal scheduling issues  She reports mild depression that has improved with Wellbutrin.   She has some fatigue.  Update 08/19/2017: She feels that she has been stable with no new exacerbation. She is  tolerating Rituxan well. If infusions have gone without any reactions.    Her last infusions were about 4 months ago. She was diagnosed with Devic disease based on recurrent optic neuritis (left 2) she did not have evidence of spinal disease. However, she does feel a little clumsy and has had a wide tandem gait.  She also feels unsteady when she closes her eyes. He also gets some painful dysesthesias at times.  She continues to report some fatigue.   Phentermine has helped her fatigue and helped stabilize her weight.  She feels her mood has done very well on Wellbutrin. She tolerates it well.  From 12/07/2016: She feels mostly stable but has had some new dizziness.   The dizziness  comes and goes but is usually unrelated to activity or position.   She may be sitting at work and gets sudden onset of rotational vertigo x several minutes.   Leaning over and getting back up may trigger a spell.     Her Devic's has been manifested mostly by left sided optic neuritis.   She had Rituxan 1000 mg x 2 in June 2016 and X 2 in December 2016 and 1000 mg x 1 January 29, 2016.    Her infusions went well.   She has not noted any new symptoms since starting Rituxan.   She did not get January 2018 infusion due to scheduling or pre-authorization issues and we will try to get her infused as soon as possible    Gait/strength/sensation:   She feels gait is doing well and denies any recent falls.   She feels strength and sensation is normal in the arms and legs.  She gets painful dysesthesias in her arms and feet intermittently.    With eyes closed she feels she wobbles.     Vision: Vision return to 20/20 but she continues to have desaturated colors out of her left eye, worse when she is hot.   Bladder and bowel function are fine.     Fatigue:   She notes fatigue that is physical as well as cognitive.  She works and is raising 3 kids and that tires her out.  She has received a benefit from phentermine.   She had some mild  headaches from phentermine but they were not bad enough to stop.    Her fatigue gets worse as the day goes on and much worse with heat.    She sleeps well at night.      Cognition is fine.  Depression is mild and improved with buproprion.     Headaches:  These are less common this year than last year.   .  Deirdre Peer Disease:   She noted visual disturbance on the left in late January 2016.  She had eye pain.  Over a week, vision loss spread from the central region to the periphery and she was only able to see out of a small corner of  her visual field out of her left eye.   Vision was normal out of the right. She saw an optometrist who noted papilitis and ordered an MRI that showed enhancement of the left optic nerve and also showing a small white matter focus in the right parietal periventricular region.   The focus was on diffusion-weighted images but did not show up on flare or ADC.    It did not enhance.   She saw Dr. Metta Clines on 08/27/2014 who started a course of IV Solu-Medrol (5 days) and set up a lumbar puncture. Spinal fluid from 09/02/2014 showed no oligoclonal bands and normal IgG index of 0.6. The myelin basic protein was minimally elevated at 2.0. The rest of the studies were normal or negative.  Vision improved but not to baseline.     The ESR, ANA and Ace were normal or negative. The NMO Ab returned positive at 6.3.     REVIEW OF SYSTEMS: Constitutional: No fevers, chills, sweats, or change in appetite.  No fatigue Eyes: as above.  No current eye pain Ear, nose and throat: No hearing loss, ear pain, nasal congestion, sore throat Cardiovascular: No chest pain, palpitations Respiratory: No shortness of breath at rest or with exertion.   No wheezes GastrointestinaI: No nausea, vomiting, diarrhea, abdominal pain, fecal incontinence Genitourinary: No dysuria or urinary retention.  She reports frequency and nocturia. Musculoskeletal: No neck pain.  Notes back pain Integumentary: No  rash, pruritus, skin lesions Neurological: as above Psychiatric: mild depression, better with prozac.  No anxiety Endocrine: No palpitations, diaphoresis, change in appetite, change in weigh or increased thirst Hematologic/Lymphatic: No anemia, purpura, petechiae. Allergic/Immunologic: No itchy/runny eyes, nasal congestion, recent allergic reactions, rashes  ALLERGIES: No Known Allergies  HOME MEDICATIONS:  Current Outpatient Medications:  .  escitalopram (LEXAPRO) 10 MG tablet, Take 10 mg by mouth daily., Disp: , Rfl:  .  phentermine (ADIPEX-P) 37.5 MG tablet, TAKE ONE TABLET BY MOUTH EVERY MORNING, Disp: 30 tablet, Rfl: 5 .  phentermine 37.5 MG capsule, Take 1 capsule (37.5 mg total) by mouth every morning., Disp: 30 capsule, Rfl: 5 .  riTUXimab (RITUXAN) 100 MG/10ML injection, Inject 1,000 mg into the vein., Disp: , Rfl:   PAST MEDICAL HISTORY: Past Medical History:  Diagnosis Date  . Depression 09/26/2014  . Headache   . Vision abnormalities     PAST SURGICAL HISTORY: Past Surgical History:  Procedure Laterality Date  . CESAREAN SECTION    . CHOLECYSTECTOMY OPEN      FAMILY HISTORY: Family History  Problem Relation Age of Onset  . High Cholesterol Mother   . Hyperthyroidism Mother   . Hypertension Father     SOCIAL HISTORY:  Social History   Socioeconomic History  . Marital status: Unknown    Spouse name: Not on file  . Number of children: Not on file  . Years of education: Not on file  . Highest education level: Not on file  Occupational History  . Occupation: Musician    Comment: [employer]  Social Needs  . Financial resource strain: Not on file  . Food insecurity:    Worry: Not on file    Inability: Not on file  . Transportation needs:    Medical: Not on file    Non-medical: Not on file  Tobacco Use  . Smoking status: Never Smoker  . Smokeless tobacco: Never Used  Substance and Sexual Activity  . Alcohol use: No     Alcohol/week: 0.0 standard drinks  .  Drug use: No  . Sexual activity: Not on file  Lifestyle  . Physical activity:    Days per week: Not on file    Minutes per session: Not on file  . Stress: Not on file  Relationships  . Social connections:    Talks on phone: Not on file    Gets together: Not on file    Attends religious service: Not on file    Active member of club or organization: Not on file    Attends meetings of clubs or organizations: Not on file    Relationship status: Not on file  . Intimate partner violence:    Fear of current or ex partner: Not on file    Emotionally abused: Not on file    Physically abused: Not on file    Forced sexual activity: Not on file  Other Topics Concern  . Not on file  Social History Narrative  . Not on file     PHYSICAL EXAM  There were no vitals filed for this visit.  There is no height or weight on file to calculate BMI.   General: The patient is well-developed and well-nourished and in no acute distress  Eye:   Visual acuity is 20/40 and OD 20/50 OS.  Funduscopic examination shows mild left optic atrophy and some blurring of the disc margin temporally. Retinal vessels are normal and symmetric.  Neurologic Exam  Mental status: The patient is alert and oriented x 3 at the time of the examination. The patient has apparent normal recent and remote memory, with an apparently normal attention span and concentration ability.   Speech is normal.  Cranial nerves: Extraocular movements are full. Pupils show a 2+ APD on the left. Color is desaturated out of OS... No dysarthria is noted.  The tongue is midline, and the patient has symmetric elevation of the soft palate.   Hearing is symmetric.  Motor:  Muscle bulk is normal.   Tone is normal. Strength is  5 / 5 in all 4 extremities. .  Sensory:    She has intact sensation to touch and vibration.  Coordination: Cerebellar testing reveals good finger-nose-finger bilaterally.  Gait and  station: Station is normal.   Gait is normal. The tandem gait is mildly wide. Romberg is borderline.   DTR's:   DTR's are 3+ at knees with mild spread.   2+ at ankles, no clonus.      DIAGNOSTIC DATA (LABS, IMAGING, TESTING) - I reviewed patient records, labs, notes, testing and imaging myself where available.     ASSESSMENT AND PLAN  Devic's disease ([place])  Optic neuritis  Left retinal detachment  High risk medication use   Tarrah Furuta A. Felecia Shelling, MD, PhD 3/64/6803, 2:12 PM Certified in Neurology, Clinical Neurophysiology, Sleep Medicine, Pain Medicine and Neuroimaging  [facility] [address] [place], [place] [address] [phone]

## 2019-06-05 ENCOUNTER — Other Ambulatory Visit: Payer: Self-pay

## 2019-06-05 ENCOUNTER — Emergency Department (HOSPITAL_COMMUNITY)
Admission: EM | Admit: 2019-06-05 | Discharge: 2019-06-05 | Disposition: A | Discharge status: Home / Self Care | Payer: BC Managed Care – PPO | Attending: Emergency Medicine | Admitting: Emergency Medicine

## 2019-06-05 ENCOUNTER — Encounter (HOSPITAL_COMMUNITY): Payer: Self-pay

## 2019-06-05 ENCOUNTER — Emergency Department (HOSPITAL_COMMUNITY): Payer: BC Managed Care – PPO

## 2019-06-05 DIAGNOSIS — R509 Fever, unspecified: Secondary | ICD-10-CM | POA: Diagnosis present

## 2019-06-05 DIAGNOSIS — R0602 Shortness of breath: Secondary | ICD-10-CM | POA: Diagnosis not present

## 2019-06-05 DIAGNOSIS — U071 COVID-19: Secondary | ICD-10-CM | POA: Diagnosis not present

## 2019-06-05 DIAGNOSIS — R519 Headache, unspecified: Secondary | ICD-10-CM | POA: Diagnosis not present

## 2019-06-05 DIAGNOSIS — Z79899 Other long term (current) drug therapy: Secondary | ICD-10-CM | POA: Diagnosis not present

## 2019-06-05 LAB — I-STAT BETA HCG BLOOD, ED (MC, WL, AP ONLY): I-stat hCG, quantitative: <5 m[IU]/mL (ref <5)

## 2019-06-05 LAB — CBC
HCT: 43.5 % (ref 36.0–46.0)
Hemoglobin: 14.6 g/dL (ref 12.0–15.0)
MCH: 28.6 pg (ref 26.0–34.0)
MCHC: 33.6 g/dL (ref 30.0–36.0)
MCV: 85.1 fL (ref 80.0–100.0)
Platelets: 147 10*3/uL — ABNORMAL LOW (ref 150–400)
RBC: 5.11 MIL/uL (ref 3.87–5.11)
RDW: 13.2 % (ref 11.5–15.5)
WBC: 3.2 10*3/uL — ABNORMAL LOW (ref 4.0–10.5)
nRBC: 0 % (ref 0.0–0.2)

## 2019-06-05 LAB — BASIC METABOLIC PANEL
Anion gap: 11 (ref 5–15)
BUN: 7 mg/dL (ref 6–20)
CO2: 24 mmol/L (ref 22–32)
Calcium: 8.6 mg/dL — ABNORMAL LOW (ref 8.9–10.3)
Chloride: 105 mmol/L (ref 98–111)
Creatinine, Ser: 0.77 mg/dL (ref 0.44–1.00)
GFR calc Af Amer: >60 mL/min (ref >60)
GFR calc non Af Amer: >60 mL/min (ref >60)
Glucose, Bld: 107 mg/dL — ABNORMAL HIGH (ref 70–99)
Potassium: 2.9 mmol/L — ABNORMAL LOW (ref 3.5–5.1)
Sodium: 140 mmol/L (ref 135–145)

## 2019-06-05 MED ORDER — DOXYCYCLINE HYCLATE 100 MG PO CAPS: 100.0000 mg | ORAL_CAPSULE | Freq: Two times a day (BID) | ORAL | 0 refills | Status: AC

## 2019-06-05 NOTE — ED Triage Notes (Signed)
Pt reports she tested positive for COVID 2 weeks ago, states her symptoms started to improve but on Sunday she started to develop a fever as high as 101 along with headache and SOB, denies cough. Pt a.o, nad noted.

## 2019-06-05 NOTE — Discharge Instructions (Addendum)
Return to the emergency department if symptoms worsen.  Continue to check pulse ox.

## 2019-06-05 NOTE — ED Provider Notes (Signed)
[facility] EMERGENCY DEPARTMENT Provider Note   CSN: [id number] Arrival date & time: 06/05/19  1227     History   Chief Complaint Chief Complaint  Patient presents with  . COVID +  . Shortness of Breath  . Headache    HPI Mary Ross is a 43 y.o. female.     The history is provided by the patient.  URI Presenting symptoms: fever   Presenting symptoms: no cough, no ear pain and no sore throat   Severity:  Mild Onset quality:  Gradual Timing:  Intermittent Progression:  Waxing and waning Chronicity:  New Relieved by:  OTC medications Worsened by:  Nothing Associated symptoms: headaches   Associated symptoms: no arthralgias   Risk factors: recent illness (COVID positive, sick for last two weeks)     Past Medical History:  Diagnosis Date  . Depression 09/26/2014  . Headache   . 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 Vision abnormalities     Patient Active Problem List   Diagnosis Date Noted  . Left retinal detachment 10/25/2018  . Other fatigue 12/07/2016  . Dysesthesia 01/14/2015  . 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 Devic's disease ([facility]) 10/14/2014  . High risk medication use 10/14/2014  . Optic neuritis 09/26/2014  . Depression 09/26/2014  . 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 Urinary frequency 09/26/2014  . Vitamin D deficiency 09/26/2014  . 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 CESAREAN SECTION    . CHOLECYSTECTOMY OPEN       OB History   No obstetric history on file.      Home Medications    Prior to Admission medications   Medication Sig Start Date End Date Taking? Authorizing Provider  doxycycline (VIBRAMYCIN) 100 MG capsule Take 1 capsule (100 mg total) by mouth 2 (two) times daily for 10 days. 06/05/19 06/15/19  Haeley Fordham, DO  escitalopram (LEXAPRO) 10 MG tablet Take 10 mg by mouth daily. 10/20/18   [provider]  phentermine (ADIPEX-P)  37.5 MG tablet TAKE ONE TABLET BY MOUTH EVERY MORNING 10/04/18   Sater, Pearletha Furl, MD  phentermine 37.5 MG capsule Take 1 capsule (37.5 mg total) by mouth every morning. 03/21/18   Levert Feinstein, MD  riTUXimab (RITUXAN) 100 MG/10ML injection Inject 1,000 mg into the vein.    [provider]    Family History Family History  Problem Relation Age of Onset  . High Cholesterol Mother   . Hyperthyroidism Mother   . Hypertension Father     Social History Social History   Tobacco Use  . Smoking status: Never Smoker  . Smokeless tobacco: Never Used  Substance Use Topics  . Alcohol use: No    Alcohol/week: 0.0 standard drinks  . Drug use: No     Allergies   Patient has no known allergies.   Review of Systems Review of Systems  Constitutional: Positive for fever. Negative for chills.  HENT: Negative for ear pain and sore throat.   Eyes: Negative for pain and visual disturbance.  Respiratory: Positive for shortness of breath (only with ambulation ). Negative for cough.   Cardiovascular: Negative for chest pain and palpitations.  Gastrointestinal: Negative for abdominal pain and vomiting.  Genitourinary: Negative for dysuria and hematuria.  Musculoskeletal: Negative for arthralgias and back pain.  Skin: Negative for color change and rash.  Neurological: Positive for headaches. Negative for seizures and syncope.  All other systems reviewed and are  negative.    Physical Exam Updated Vital Signs BP (!) 115/91   Pulse 93   Temp 98.4 F (36.9 C) (Oral)   Resp 17   Ht 5\' 7"  (1.702 m)   Wt 122.5 kg   LMP 05/15/2019   SpO2 97%   BMI 42.29 kg/m   Physical Exam Vitals signs and nursing note reviewed.  Constitutional:      General: She is not in acute distress.    Appearance: She is well-developed. She is not ill-appearing.  HENT:     Head: Normocephalic and atraumatic.  Eyes:     Extraocular Movements: Extraocular movements intact.     Conjunctiva/sclera:  Conjunctivae normal.     Pupils: Pupils are equal, round, and reactive to light.  Neck:     Musculoskeletal: Normal range of motion and neck supple.  Cardiovascular:     Rate and Rhythm: Normal rate and regular rhythm.     Pulses: Normal pulses.     Heart sounds: Normal heart sounds. No murmur.  Pulmonary:     Effort: Pulmonary effort is normal. No tachypnea or respiratory distress.  Abdominal:     Palpations: Abdomen is soft.     Tenderness: There is no abdominal tenderness.  Musculoskeletal:     Right lower leg: No edema.     Left lower leg: No edema.  Skin:    General: Skin is warm and dry.     Capillary Refill: Capillary refill takes less than 2 seconds.  Neurological:     General: No focal deficit present.     Mental Status: She is alert.  Psychiatric:        Mood and Affect: Mood normal.      ED Treatments / Results  Labs (all labs ordered are listed, but only abnormal results are displayed) Labs Reviewed  BASIC METABOLIC PANEL - Abnormal; Notable for the following components:      Result Value   Potassium 2.9 (*)    Glucose, Bld 107 (*)    Calcium 8.6 (*)    All other components within normal limits  CBC - Abnormal; Notable for the following components:   WBC 3.2 (*)    Platelets 147 (*)    All other components within normal limits  I-STAT BETA HCG BLOOD, ED ([facility], [facility], [facility] ONLY)    EKG None  Radiology Dg Chest Portable 1 View  Result Date: 06/05/2019 CLINICAL DATA:  Short of breath, COVID positive EXAM: PORTABLE CHEST 1 VIEW COMPARISON:  None FINDINGS: Nonspecific mild interstitial prominence. No focal consolidation. No pleural effusion or pneumothorax. Normal heart size. IMPRESSION: Nonspecific mild interstitial prominence, which could reflect atypical pneumonia. Electronically Signed   By: Guadlupe SpanishPraneil  Patel M.D.   On: 06/05/2019 13:34    Procedures Procedures (including critical care time)  Medications Ordered in ED Medications - No data to display    Initial Impression / Assessment and Plan / ED Course  I have reviewed the triage vital signs and the nursing notes.  Pertinent labs & imaging results that were available during my care of the patient were reviewed by me and considered in my medical decision making (see chart for details).     Mary GoodellRhonda Ross is a 43 year old female with history of depression, headache who presents to the ED with ongoing Covid symptoms.  Continues to have some fevers, shortness of breath, body aches.  Tested positive about 2 weeks ago.  However no hypoxia both at rest and with ambulation.  Has a white  count of 3.2.  Chest x-ray still shows likely infectious process.  Likely coronavirus.  However will treat with doxycycline in case atypical infection.  However, patient overall was given reassurance.  She had no signs of respiratory distress.  Given return precautions.  She does have a home pulse ox.  Recommend continued self-isolation until she is fever free for at least 5 days.  Discharged in good condition.  This chart was dictated using voice recognition software.  Despite best efforts to proofread,  errors can occur which can change the documentation meaning.  Mary Ross was evaluated in Emergency Department on 06/05/2019 for the symptoms described in the history of present illness. She was evaluated in the context of the global COVID-19 pandemic, which necessitated consideration that the patient might be at risk for infection with the SARS-CoV-2 virus that causes COVID-19. Institutional protocols and algorithms that pertain to the evaluation of patients at risk for COVID-19 are in a state of rapid change based on information released by regulatory bodies including the CDC and federal and state organizations. These policies and algorithms were followed during the patient's care in the ED.  Final Clinical Impressions(s) / ED Diagnoses   Final diagnoses:  [id number]    ED Discharge Orders         Ordered     doxycycline (VIBRAMYCIN) 100 MG capsule  2 times daily     06/05/19 1507           Lennice Sites, DO 06/05/19 1548

## 2019-06-14 ENCOUNTER — Emergency Department (HOSPITAL_COMMUNITY): Payer: BC Managed Care – PPO

## 2019-06-14 ENCOUNTER — Other Ambulatory Visit: Payer: Self-pay

## 2019-06-14 ENCOUNTER — Emergency Department (HOSPITAL_COMMUNITY)
Admission: EM | Admit: 2019-06-14 | Discharge: 2019-06-14 | Disposition: A | Discharge status: Home / Self Care | Payer: BC Managed Care – PPO | Attending: Emergency Medicine | Admitting: Emergency Medicine

## 2019-06-14 ENCOUNTER — Encounter (HOSPITAL_COMMUNITY): Payer: Self-pay

## 2019-06-14 DIAGNOSIS — R0602 Shortness of breath: Secondary | ICD-10-CM | POA: Diagnosis present

## 2019-06-14 DIAGNOSIS — J1282 Pneumonia due to coronavirus disease 2019: Secondary | ICD-10-CM

## 2019-06-14 DIAGNOSIS — U071 COVID-19: Secondary | ICD-10-CM | POA: Diagnosis not present

## 2019-06-14 DIAGNOSIS — Z79899 Other long term (current) drug therapy: Secondary | ICD-10-CM | POA: Diagnosis not present

## 2019-06-14 DIAGNOSIS — J1289 Other viral pneumonia: Secondary | ICD-10-CM | POA: Insufficient documentation

## 2019-06-14 LAB — CBC WITH DIFFERENTIAL/PLATELET
Abs Immature Granulocytes: 0.06 10*3/uL (ref 0.00–0.07)
Basophils Absolute: 0 10*3/uL (ref 0.0–0.1)
Basophils Relative: 0 %
Eosinophils Absolute: 0 10*3/uL (ref 0.0–0.5)
Eosinophils Relative: 0 %
HCT: 44.2 % (ref 36.0–46.0)
Hemoglobin: 15.2 g/dL — ABNORMAL HIGH (ref 12.0–15.0)
Immature Granulocytes: 1 %
Lymphocytes Relative: 12 %
Lymphs Abs: 0.8 10*3/uL (ref 0.7–4.0)
MCH: 28.7 pg (ref 26.0–34.0)
MCHC: 34.4 g/dL (ref 30.0–36.0)
MCV: 83.4 fL (ref 80.0–100.0)
Monocytes Absolute: 0.7 10*3/uL (ref 0.1–1.0)
Monocytes Relative: 10 %
Neutro Abs: 5.2 10*3/uL (ref 1.7–7.7)
Neutrophils Relative %: 77 %
Platelets: 206 10*3/uL (ref 150–400)
RBC: 5.3 MIL/uL — ABNORMAL HIGH (ref 3.87–5.11)
RDW: 13.4 % (ref 11.5–15.5)
WBC: 6.7 10*3/uL (ref 4.0–10.5)
nRBC: 0 % (ref 0.0–0.2)

## 2019-06-14 LAB — BASIC METABOLIC PANEL
Anion gap: 12 (ref 5–15)
BUN: 13 mg/dL (ref 6–20)
CO2: 24 mmol/L (ref 22–32)
Calcium: 8.9 mg/dL (ref 8.9–10.3)
Chloride: 105 mmol/L (ref 98–111)
Creatinine, Ser: 0.63 mg/dL (ref 0.44–1.00)
GFR calc Af Amer: >60 mL/min (ref >60)
GFR calc non Af Amer: >60 mL/min (ref >60)
Glucose, Bld: 90 mg/dL (ref 70–99)
Potassium: 2.8 mmol/L — ABNORMAL LOW (ref 3.5–5.1)
Sodium: 141 mmol/L (ref 135–145)

## 2019-06-14 LAB — TROPONIN I (HIGH SENSITIVITY): Troponin I (High Sensitivity): 5 ng/L (ref <18)

## 2019-06-14 MED ORDER — IOHEXOL 350 MG/ML SOLN
100.0000 mL | Freq: Once | INTRAVENOUS | Status: AC | PRN
  Administered 2019-06-14: 100 mL via INTRAVENOUS

## 2019-06-14 MED ORDER — POTASSIUM CHLORIDE CRYS ER 20 MEQ PO TBCR
40.0000 meq | EXTENDED_RELEASE_TABLET | Freq: Once | ORAL | Status: AC
  Administered 2019-06-14: 40 meq via ORAL
  Filled 2019-06-14: qty 2

## 2019-06-14 NOTE — ED Triage Notes (Signed)
Pt arrives POV for eval of ongoing SOB, worsening DOE and cough in the setting of recent Covid 19 infection. Pt reports hx of Covid 1 month ago, started on abx 9 days ago for persistent PNA. Today pt presented to UC for eval of worsening SOB and DOE. Pt is mildy dyspneic in triage, 98% on RA. Denies CP, N/V. Pt is GCS 15, A&ox4.

## 2019-06-14 NOTE — ED Provider Notes (Signed)
Care of patient assumed from Dr. Court Joy at 3:33 PM.  Agree with history, physical exam and plan.  See their note for further details.  Briefly, 43 y.o. female with PMH/PSH as below who presents with ongoing shortness of breath in setting of known Covid 37.  Past Medical History:  Diagnosis Date  . Depression 09/26/2014  . Headache   . 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 Vision abnormalities    Past Surgical History:  Procedure Laterality Date  . CESAREAN SECTION    . CHOLECYSTECTOMY OPEN        Current Plan: CT PE study pending    MDM/ED Course: CT PE study obtained without evidence of PE.  There is evidence of multifocal pneumonia, radiology reports likely atypical/viral in etiology.  Per nursing, patient has ambulated while maintaining her oxygenation saturations in the 90s.  Upon assessment, patient is alert and oriented and in no acute respiratory distress.  She reports she has had ongoing stable symptoms this week and things are now acutely worse just are not getting better.  She has had some intermittent fever.  No fever today.  Patient is currently taking Augmentin therapy given to her by urgent care earlier in the week.  Overall, concern for symptomatic viral pneumonia in setting of known COVID-19 illness.  Patient states her mother is staying with her to help care for her and her children and she feels comfortable going home at this time.  On final assessment, RR 18-20 at bedside while conversational, HR in 70s, O2 97% on room air.  Given reassuring vitals and overall well appearance and stability of symptoms, feel patient stable for discharge but encouraged to follow closely with PCP and patient was encouraged to return to the emergency department should symptoms worsen.  She endorses understanding.  Strict return precautions given.  Significant labs/images:  I personally reviewed and interpreted all labs.     Burns Spain, MD 06/14/19 2114    Blanchie Dessert,  MD 06/15/19 1144

## 2019-06-14 NOTE — ED Provider Notes (Signed)
[facility] EMERGENCY DEPARTMENT Provider Note   CSN: [id number] Arrival date & time: 06/14/19  1243     History   Chief Complaint Chief Complaint  Patient presents with  . Shortness of Breath  . Covid Positive    HPI Mary Ross is a 43 y.o. 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 Patient came to the ED on 11/24 to be evaluated and given ten day course of doxycycline. In addition she says she visited urgent care this weekend where she received diagnoses of pneumonia and given antibiotics ( Rocephin Injection, Z-pack, and Augmentin). She has been resting on her couch all week. SOB started to worsen in the last day and she came to ED.     HPI  Past Medical History:  Diagnosis Date  . Depression 09/26/2014  . Headache   . Neuromyelitis optica (devic) ([place]) 2017  . Vision abnormalities     Patient Active Problem List   Diagnosis Date Noted  . Left retinal detachment 10/25/2018  . Other fatigue 12/07/2016  . Dysesthesia 01/14/2015  . 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 High risk medication use 10/14/2014  . Optic neuritis 09/26/2014  . Depression 09/26/2014  . 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 Urinary frequency 09/26/2014  . Vitamin D deficiency 09/26/2014  . 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 CESAREAN SECTION    . CHOLECYSTECTOMY OPEN       OB History   No obstetric history on file.      Home Medications    Prior to Admission medications   Medication Sig Start Date End Date Taking? Authorizing Provider  doxycycline (VIBRAMYCIN) 100 MG capsule Take 1 capsule (100 mg total) by mouth 2 (two) times daily for 10 days. 06/05/19 06/15/19  Curatolo, Adam, DO  escitalopram (LEXAPRO) 10 MG tablet Take 10 mg by mouth daily. 10/20/18   [provider]  phentermine (ADIPEX-P) 37.5 MG tablet TAKE ONE TABLET BY MOUTH EVERY MORNING 10/04/18   Sater, Nanine Means, MD  phentermine 37.5 MG capsule Take 1 capsule (37.5 mg total) by mouth every morning. 03/21/18   Marcial Pacas, MD  riTUXimab (RITUXAN) 100 MG/10ML injection Inject 1,000 mg into the vein.    [provider]    Family History Family History  Problem Relation Age of Onset  . High Cholesterol Mother   . Hyperthyroidism Mother   . Hypertension Father     Social History Social History   Tobacco Use  . Smoking status: Never Smoker  . Smokeless tobacco: Never Used  Substance Use Topics  . Alcohol use: No    Alcohol/week: 0.0 standard drinks  . Drug use: No     Allergies   Patient has no known allergies.   Review of Systems Review of Systems  Constitutional: Positive for fatigue and fever.  HENT: Negative for sinus pain and sore throat.   Respiratory: Positive for cough and shortness of breath.   Cardiovascular: Positive for chest pain.     Physical Exam Updated Vital Signs BP 113/81   Pulse 79   Temp 98.9 F (37.2 C) (Oral)   Resp (!) 27   Ht 5\' 7"  (1.702 m)   Wt 124.7 kg   LMP 05/15/2019   SpO2 97%   BMI 43.07 kg/m   Physical Exam Constitutional:  Appearance: She is well-developed. She is ill-appearing.  HENT:     Head: Normocephalic and atraumatic.  Neck:     Thyroid: No thyromegaly.     Trachea: No tracheal deviation.     Comments: No lymphadnopathy Cardiovascular:     Rate and Rhythm: Normal rate and regular rhythm.     Heart sounds: No murmur. No friction rub. No gallop.   Pulmonary:     Effort: Pulmonary effort is normal.     Breath sounds: No wheezing, rhonchi or rales.     Comments: cough with deep inspiration Abdominal:     General: Bowel sounds are normal.     Palpations: Abdomen is soft.  Musculoskeletal:     Right lower leg: No edema.     Left lower leg: No edema.  Skin:    General: Skin is warm and dry.   Neurological:     General: No focal deficit present.     Mental Status: She is oriented to person, place, and time.  Psychiatric:        Mood and Affect: Mood normal.        Behavior: Behavior normal.      ED Treatments / Results  Labs (all labs ordered are listed, but only abnormal results are displayed) Labs Reviewed  BASIC METABOLIC PANEL - Abnormal; Notable for the following components:      Result Value   Potassium 2.8 (*)    All other components within normal limits  CBC WITH DIFFERENTIAL/PLATELET - Abnormal; Notable for the following components:   RBC 5.30 (*)    Hemoglobin 15.2 (*)    All other components within normal limits  TROPONIN I (HIGH SENSITIVITY)    EKG None  Radiology Dg Chest Port 1 View  Result Date: 06/14/2019 CLINICAL DATA:  Shortness of breath. Cough and shortness of breath. COVID (+) November 10th. EXAM: PORTABLE CHEST 1 VIEW COMPARISON:  Chest radiograph 06/05/2019 FINDINGS: Heart size within normal limits. Redemonstrated mild nonspecific interstitial prominence. New linear opacities within the left mid lung. No evidence of pleural effusion or pneumothorax. No acute bony abnormality. Overlying cardiac monitoring leads. IMPRESSION: Redemonstrated nonspecific mild interstitial prominence, which may reflect sequela of atypical/viral pneumonia. New curvilinear opacities within the left mid lung could reflect the same process or platelike atelectasis. Electronically Signed   By: Jackey Loge DO   On: 06/14/2019 13:50    Procedures Procedures (including critical care time)  Medications Ordered in ED Medications - No data to display   Initial Impression / Assessment and Plan / ED Course  I have reviewed the triage vital signs and the nursing notes.  Pertinent labs & imaging results that were available during my care of the patient were reviewed by me and considered in my medical decision making (see chart for details).  Presents with shortness of  breath with recent diagnosis of COVID19 followed by pneumonia treated with antibiotics.  Patient satting well room air, but tachypneic to 34 breaths per minute.  Consider PE, CTPE pending.   Chest xray shows atelectasis .  At shift change patient was transferred to [facility] with following test pending for their evaluation.   Final Clinical Impressions(s) / ED Diagnoses   Final diagnoses:  None    ED Discharge Orders    None       Albertha Ghee, MD 06/14/19 1555    Gerhard Munch, MD 06/15/19 [id number]

## 2019-06-14 NOTE — ED Notes (Addendum)
Pt ambulated on RA Spo2 remained 95-96%.   Pt began having a coughing attack and HR increased to 130's, pt conitnued to have forceful coughing until she got back in bed.  Spo2 remained 95-96% but HR increased.  Pt states that she gets coughing attacks with exertion (ambulation and movement).  Ice water given to soothe throat.

## 2020-05-15 NOTE — Progress Notes (Signed)
Chief Complaint  Patient presents with  . Follow-up  . Devic's Disease     HISTORY OF PRESENT ILLNESS: Today 05/19/20  Mary Ross is a 44 y.o. 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 She was previously on rituximab injections. Last injection was around April or May of 2020. Last imaging 2016. No spinal lesions.   She reports that she has been doing well. No new numbness or weakness. She denies changes in her gait. She reports that she has always been clumsy but does not feel symptoms have worsened. No falls. No bowel or bladder changes.   She denies new vision concerns. She did have a complete retinal detachment in 2019. She has had continued symptoms in the left eye since. She has blurred vision and extreme light sensitivity.   She feels mood is good on Lexapro. She is followed by her PCP. She does not sleep well. She wakes up multiple times during the night. She usually gets out of bed around 5:30. She is a single mom of 3 children. She is hesitant to consider sleep aid.      HISTORY (copied from Dr Garth Bigness note on 10/25/2018)  Her last Rituxan She feels she is generally doing well with no new symptoms over the lat few months.   She had left sided visual symptoms and has had 4 laser surgery and needs to have another one.    She otherwise does not note neurologic symptoms.  Specifically she has no difficulties with gait or balance.  Strength and sensation are normal.  No dysesthesias.  Bladder function is doing well.  She has fatigue and mild sleepiness at times.  Phentermine has helped her fatigue/sleepiness.   Initially, she noticed some improvement with weight loss but there was no further weight reduction..   She notes some depression but feels she is doing well.  She switched from Wellbutrin to Lexapro and feels she is doing better.    She sleeps well most nights.  She does not have any  difficulties with cognition.  Observations/Objective: She is a well-developed well-nourished woman in no acute distress.  Head is normocephalic and atraumatic.  Sclera are anicteric.  Pharynx and tongue appear normal.  Good range of motion in neck and shoulder.  She is alert and fully oriented with fluent speech and attention, knowledge and memory.  Extraocular muscles are intact.  She does not have ptosis but closes her left eye due to lights bothering her on that side.  Facial strength appeared normal.  Hearing impaired normal.  Palatal elevation and tongue protrusion was midline.  In the arms, she appeared to have normal strength and had good rapid rotating movements and finger-nose-finger.  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   Continue Rituxan.  We discussed the pros and cons of staying on Rituxan with COVID-19.  She would like to delay her next dose by a month. 2.   She will continue Lexapro. 3.   Follow-up with me in 6 months or sooner if there are new or worsening neurologic symptoms.  Follow Up Instructions: I discussed the assessment and treatment plan with the patient. The patient was provided an opportunity to ask questions and all were answered. The patient agreed with the plan and demonstrated an understanding of the instructions.    The patient was advised  to call back or seek an in-person evaluation if the symptoms worsen or if the condition fails to improve as anticipated.  I provided 25 minutes of non-face-to-face time during this encounter. _________________ From previous notes Update 04/24/2018: She had the onset of floaters in her vision on the left.   She also notes left color desaturation and reduced VA.   VA is 20/40 OD and 20/50 OS She also has left eye pain and headache, worse when she moves her eyes.     Her gait is slightly off balanced but no worse recently.     She has no  numbness or weakness.    Bladder is fine.  She has been on Rituxan initially with split dose of 1000 mg 2 weeks apart every 6 months and then 1000 mg every 6 months she is several weeks late due to personal scheduling issues  She reports mild depression that has improved with Wellbutrin.   She has some fatigue.  Update 08/19/2017: She feels that she has been stable with no new exacerbation. She is tolerating Rituxan well. If infusions have gone without any reactions.    Her last infusions were about 4 months ago. She was diagnosed with Devic disease based on recurrent optic neuritis (left 2) she did not have evidence of spinal disease. However, she does feel a little clumsy and has had a wide tandem gait.  She also feels unsteady when she closes her eyes. He also gets some painful dysesthesias at times.  She continues to report some fatigue.   Phentermine has helped her fatigue and helped stabilize her weight.  She feels her mood has done very well on Wellbutrin. She tolerates it well.  From 12/07/2016: She feels mostly stable but has had some new dizziness.   The dizziness  comes and goes but is usually unrelated to activity or position.   She may be sitting at work and gets sudden onset of rotational vertigo x several minutes.   Leaning over and getting back up may trigger a spell.     Her Devic's has been manifested mostly by left sided optic neuritis.   She had Rituxan 1000 mg x 2 in June 2016 and X 2 in December 2016 and 1000 mg x 1 January 29, 2016.    Her infusions went well.   She has not noted any new symptoms since starting Rituxan.   She did not get January 2018 infusion due to scheduling or pre-authorization issues and we will try to get her infused as soon as possible    Gait/strength/sensation:   She feels gait is doing well and denies any recent falls.   She feels strength and sensation is normal in the arms and legs.  She gets painful dysesthesias in her arms and feet  intermittently.    With eyes closed she feels she wobbles.     Vision: Vision return to 20/20 but she continues to have desaturated colors out of her left eye, worse when she is hot.   Bladder and bowel function are fine.     Fatigue:   She notes fatigue that is physical as well as cognitive.  She works and is raising 3 kids and that tires her out.  She has received a benefit from phentermine.   She had some mild headaches from phentermine but they were not bad enough to stop.    Her fatigue gets worse as the day goes on and much worse with heat.    She  sleeps well at night.      Cognition is fine.  Depression is mild and improved with buproprion.     Headaches:  These are less common this year than last year.   .  Deirdre Peer Disease:   She noted visual disturbance on the left in late January 2016.  She had eye pain.  Over a week, vision loss spread from the central region to the periphery and she was only able to see out of a small corner of her visual field out of her left eye.   Vision was normal out of the right. She saw an optometrist who noted papilitis and ordered an MRI that showed enhancement of the left optic nerve and also showing a small white matter focus in the right parietal periventricular region.   The focus was on diffusion-weighted images but did not show up on flare or ADC.    It did not enhance.   She saw Dr. Metta Clines on 08/27/2014 who started a course of IV Solu-Medrol (5 days) and set up a lumbar puncture. Spinal fluid from 09/02/2014 showed no oligoclonal bands and normal IgG index of 0.6. The myelin basic protein was minimally elevated at 2.0. The rest of the studies were normal or negative.  Vision improved but not to baseline.     The ESR, ANA and Ace were normal or negative. The NMO Ab returned positive at 6.3.      REVIEW OF SYSTEMS: Out of a complete 14 system review of symptoms, the patient complains only of the following symptoms, decreased visual aquity and  light sensitivity of left eye, insomnia and all other reviewed systems are negative.   ALLERGIES: No Known Allergies   HOME MEDICATIONS: Outpatient Medications Prior to Visit  Medication Sig Dispense Refill  . escitalopram (LEXAPRO) 10 MG tablet Take 10 mg by mouth daily.    . Liraglutide -Weight Management (SAXENDA) 18 MG/3ML SOPN Inject into the skin.    Marland Kitchen riTUXimab (RITUXAN) 100 MG/10ML injection Inject 1,000 mg into the vein.    . phentermine (ADIPEX-P) 37.5 MG tablet TAKE ONE TABLET BY MOUTH EVERY MORNING 30 tablet 5  . phentermine 37.5 MG capsule Take 1 capsule (37.5 mg total) by mouth every morning. 30 capsule 5   No facility-administered medications prior to visit.     PAST MEDICAL HISTORY: Past Medical History:  Diagnosis Date  . Depression 09/26/2014  . Headache   . Neuromyelitis optica (devic) ([place]) 2017  . Vision abnormalities      PAST SURGICAL HISTORY: Past Surgical History:  Procedure Laterality Date  . CESAREAN SECTION    . CHOLECYSTECTOMY OPEN       FAMILY HISTORY: Family History  Problem Relation Age of Onset  . High Cholesterol Mother   . Hyperthyroidism Mother   . Hypertension Father      SOCIAL HISTORY: Social History   Socioeconomic History  . Marital status: Unknown    Spouse name: Not on file  . Number of children: Not on file  . Years of education: Not on file  . Highest education level: Not on file  Occupational History  . Occupation: Health Educator    Comment: [employer]  Tobacco Use  . Smoking status: Never Smoker  . Smokeless tobacco: Never Used  Substance and Sexual Activity  . Alcohol use: No    Alcohol/week: 0.0 standard drinks  . Drug use: No  . Sexual activity: Not on file  Other Topics  Concern  . Not on file  Social History Narrative  . Not on file   Social Determinants of Health   Financial Resource Strain:   . Difficulty of Paying Living Expenses: Not on file  Food Insecurity:   .  Worried About Charity fundraiser in the Last Year: Not on file  . Ran Out of Food in the Last Year: Not on file  Transportation Needs:   . Lack of Transportation (Medical): Not on file  . Lack of Transportation (Non-Medical): Not on file  Physical Activity:   . Days of Exercise per Week: Not on file  . Minutes of Exercise per Session: Not on file  Stress:   . Feeling of Stress : Not on file  Social Connections:   . Frequency of Communication with Friends and Family: Not on file  . Frequency of Social Gatherings with Friends and Family: Not on file  . Attends Religious Services: Not on file  . Active Member of Clubs or Organizations: Not on file  . Attends Archivist Meetings: Not on file  . Marital Status: Not on file  Intimate Partner Violence:   . Fear of Current or Ex-Partner: Not on file  . Emotionally Abused: Not on file  . Physically Abused: Not on file  . Sexually Abused: Not on file      PHYSICAL EXAM  Vitals:   05/19/20 0916  BP: 125/81  Pulse: 100  Weight: 264 lb (119.7 kg)  Height: 5' 7"  (1.702 m)   Body mass index is 41.35 kg/m.   Generalized: Well developed, in no acute distress   Cardiology: Normal rate and rhythm, no murmur auscultated Respiratory: Clear to auscultation bilaterally  Neurological examination  Mentation: Alert oriented to time, place, history taking. Follows all commands speech and language fluent Cranial nerve II-XII: Pupils were equal round reactive to light. Extraocular movements were full, visual field were full on confrontational test. Facial sensation and strength were normal. Head turning and shoulder shrug  were normal and symmetric. Motor: The motor testing reveals 5 over 5 strength of all 4 extremities. Good symmetric motor tone is noted throughout.  Sensory: Sensory testing is intact to soft touch on all 4 extremities. No evidence of extinction is noted.  Coordination: Cerebellar testing reveals good  finger-nose-finger and heel-to-shin bilaterally.  Gait and station: Gait is normal. Station normal.  Reflexes: Deep tendon reflexes are symmetric and normal bilaterally.     DIAGNOSTIC DATA (LABS, IMAGING, TESTING) - I reviewed patient records, labs, notes, testing and imaging myself where available.  Lab Results  Component Value Date   WBC 6.7 06/14/2019   HGB 15.2 (H) 06/14/2019   HCT 44.2 06/14/2019   MCV 83.4 06/14/2019   PLT 206 06/14/2019      Component Value Date/Time   NA 141 06/14/2019 1336   K 2.8 (L) 06/14/2019 1336   CL 105 06/14/2019 1336   CO2 24 06/14/2019 1336   GLUCOSE 90 06/14/2019 1336   BUN 13 06/14/2019 1336   CREATININE 0.63 06/14/2019 1336   CALCIUM 8.9 06/14/2019 1336   PROT 6.7 12/07/2016 1636   ALBUMIN 4.4 12/07/2016 1636   AST 20 12/07/2016 1636   ALT 16 12/07/2016 1636   ALKPHOS 68 12/07/2016 1636   BILITOT 0.4 12/07/2016 1636   GFRNONAA >60 06/14/2019 1336   GFRAA >60 06/14/2019 1336   No results found for: CHOL, HDL, LDLCALC, LDLDIRECT, TRIG, CHOLHDL No results found for: HGBA1C No results found for: VITAMINB12 No  results found for: TSH    ASSESSMENT AND PLAN  44 y.o. year old female  has a past medical history of Depression (09/26/2014), Headache, Neuromyelitis optica (devic) ([place]) (2017), and Vision abnormalities. 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  She has been off the rituximab treatment for about 18 months.  She wishes to resume therapy.   We will update labs and imaging.  She will continue close follow-up with primary care.  We have discussed concerns of insomnia.  She is not interested in sleep aids at this time.  I have reviewed quality sleep habits and recommended healthy lifestyle changes.  She will follow-up with Dr. Felecia Shelling in 6 months, sooner if needed.  Continue regular follow-up with primary care as advised.  She verbalizes understanding and agreement with this plan.  I spent 20 minutes of face-to-face and non-face-to-face time with patient.  This included previsit chart review, lab review, study review, order entry, electronic health record documentation, patient education.    Debbora Presto, MSN, FNP-C 05/19/2020, 10:17 AM  [facility] [address] [place], [place] [address] [phone]

## 2020-05-19 ENCOUNTER — Other Ambulatory Visit: Payer: Self-pay

## 2020-05-19 ENCOUNTER — Encounter: Payer: Self-pay | Admitting: Family Medicine

## 2020-05-19 ENCOUNTER — Ambulatory Visit: Payer: BC Managed Care – PPO | Admitting: Family Medicine

## 2020-05-19 VITALS — BP 125/81 | HR 100 | Ht 67.0 in | Wt 264.0 lb

## 2020-05-19 DIAGNOSIS — G360 Neuromyelitis optica [Devic]: Secondary | ICD-10-CM

## 2020-05-19 DIAGNOSIS — Z79899 Other long term (current) drug therapy: Secondary | ICD-10-CM

## 2020-05-19 DIAGNOSIS — H3322 Serous retinal detachment, left eye: Secondary | ICD-10-CM

## 2020-05-19 DIAGNOSIS — G4700 Insomnia, unspecified: Secondary | ICD-10-CM | POA: Diagnosis not present

## 2020-05-19 DIAGNOSIS — E559 Vitamin D deficiency, unspecified: Secondary | ICD-10-CM | POA: Diagnosis not present

## 2020-05-19 NOTE — Progress Notes (Signed)
I have read the note, and I agree with the clinical assessment and plan.  Harvir Patry A. Delpha Perko, MD, PhD, FAAN Certified in Neurology, Clinical Neurophysiology, Sleep Medicine, Pain Medicine and Neuroimaging  [facility] [address], [address] [place], [place] [address] [phone]  

## 2020-05-19 NOTE — Progress Notes (Signed)
I have read the note, and I agree with the clinical assessment and plan.  [name] [name], MD, PhD, FAAN Certified in Neurology, Clinical Neurophysiology, Sleep Medicine, Pain Medicine and Neuroimaging  [facility] [address], [address] [place], [place] [address] [phone]  

## 2020-05-19 NOTE — Patient Instructions (Addendum)
Below is our plan:  We will restart rituximab-abbs (Truxima). I will update labs today and order brain MRI for monitoring. We will call you to schedule treatment with infusion suite.   Please make sure you are staying well hydrated. I recommend 50-60 ounces daily. Well balanced diet and regular exercise encouraged.   Please continue follow up with care team as directed.   Follow up with Dr Epimenio Foot in 6 months   You may receive a survey regarding today's visit. I encourage you to leave honest feed back as I do use this information to improve patient care. Thank you for seeing me today!      Quality Sleep Information, Adult Quality sleep is important for your mental and physical health. It also improves your quality of life. Quality sleep means you:  Are asleep for most of the time you are in bed.  Fall asleep within 30 minutes.  Wake up no more than once a night.  Are awake for no longer than 20 minutes if you do wake up during the night. Most adults need 7-8 hours of quality sleep each night. How can poor sleep affect me? If you do not get enough quality sleep, you may have:  Mood swings.  Daytime sleepiness.  Confusion.  Decreased reaction time.  Sleep disorders, such as insomnia and sleep apnea.  Difficulty with: ? Solving problems. ? Coping with stress. ? Paying attention. These issues may affect your performance and productivity at work, school, and at home. Lack of sleep may also put you at higher risk for accidents, suicide, and risky behaviors. If you do not get quality sleep you may also be at higher risk for several health problems, including:  Infections.  Type 2 diabetes.  Heart disease.  High blood pressure.  Obesity.  Worsening of long-term conditions, like arthritis, kidney disease, depression, Parkinson's disease, and epilepsy. What actions can I take to get more quality sleep?      Stick to a sleep schedule. Go to sleep and wake up at about  the same time each day. Do not try to sleep less on weekdays and make up for lost sleep on weekends. This does not work.  Try to get about 30 minutes of exercise on most days. Do not exercise 2-3 hours before going to bed.  Limit naps during the day to 30 minutes or less.  Do not use any products that contain nicotine or tobacco, such as cigarettes or e-cigarettes. If you need help quitting, ask your health care provider.  Do not drink caffeinated beverages for at least 8 hours before going to bed. Coffee, tea, and some sodas contain caffeine.  Do not drink alcohol close to bedtime.  Do not eat large meals close to bedtime.  Do not take naps in the late afternoon.  Try to get at least 30 minutes of sunlight every day. Morning sunlight is best.  Make time to relax before bed. Reading, listening to music, or taking a hot bath promotes quality sleep.  Make your bedroom a place that promotes quality sleep. Keep your bedroom dark, quiet, and at a comfortable room temperature. Make sure your bed is comfortable. Take out sleep distractions like TV, a computer, smartphone, and bright lights.  If you are lying awake in bed for longer than 20 minutes, get up and do a relaxing activity until you feel sleepy.  Work with your health care provider to treat medical conditions that may affect sleeping, such as: ? Nasal obstruction. ?  Snoring. ? Sleep apnea and other sleep disorders.  Talk to your health care provider if you think any of your prescription medicines may cause you to have difficulty falling or staying asleep.  If you have sleep problems, talk with a sleep consultant. If you think you have a sleep disorder, talk with your health care provider about getting evaluated by a specialist. Where to find more information  National Sleep Foundation website: https://sleepfoundation.org  National Heart, Lung, and Blood Institute (NHLBI):  https://hall.info/.pdf  Centers for Disease Control and Prevention (CDC): DetailSports.is Contact a health care provider if you:  Have trouble getting to sleep or staying asleep.  Often wake up very early in the morning and cannot get back to sleep.  Have daytime sleepiness.  Have daytime sleep attacks of suddenly falling asleep and sudden muscle weakness (narcolepsy).  Have a tingling sensation in your legs with a strong urge to move your legs (restless legs syndrome).  Stop breathing briefly during sleep (sleep apnea).  Think you have a sleep disorder or are taking a medicine that is affecting your quality of sleep. Summary  Most adults need 7-8 hours of quality sleep each night.  Getting enough quality sleep is an important part of health and well-being.  Make your bedroom a place that promotes quality sleep and avoid things that may cause you to have poor sleep, such as alcohol, caffeine, smoking, and large meals.  Talk to your health care provider if you have trouble falling asleep or staying asleep. This information is not intended to replace advice given to you by your health care provider. Make sure you discuss any questions you have with your health care provider. Document Revised: 10/05/2017 Document Reviewed: 10/05/2017 Elsevier Patient Education  2020 [employer].

## 2020-05-20 ENCOUNTER — Telehealth: Payer: Self-pay | Admitting: Family Medicine

## 2020-05-20 NOTE — Telephone Encounter (Signed)
LVM for pt to call back about scheduling mri  [employer] auth: [id number] (exp. 05/19/20 to 11/14/20

## 2020-05-20 NOTE — Telephone Encounter (Signed)
no to the covid questions MR Brain w/wo contrast Amy Lomax [employer] Auth: [id number] (exp. 05/19/20 to 11/14/20). Patient is scheduled at [facility] for 05/21/20.

## 2020-05-21 ENCOUNTER — Telehealth: Payer: Self-pay | Admitting: *Deleted

## 2020-05-21 ENCOUNTER — Ambulatory Visit: Payer: BC Managed Care – PPO

## 2020-05-21 ENCOUNTER — Other Ambulatory Visit: Payer: Self-pay

## 2020-05-21 DIAGNOSIS — G360 Neuromyelitis optica [Devic]: Secondary | ICD-10-CM

## 2020-05-21 LAB — COMPREHENSIVE METABOLIC PANEL
ALT: 23 IU/L (ref 0–32)
AST: 33 IU/L (ref 0–40)
Albumin/Globulin Ratio: 1.9 (ref 1.2–2.2)
Albumin: 4.1 g/dL (ref 3.8–4.8)
Alkaline Phosphatase: 56 IU/L (ref 44–121)
BUN/Creatinine Ratio: 12 (ref 9–23)
BUN: 9 mg/dL (ref 6–24)
Bilirubin Total: 0.5 mg/dL (ref 0.0–1.2)
CO2: 23 mmol/L (ref 20–29)
Calcium: 9.2 mg/dL (ref 8.7–10.2)
Chloride: 105 mmol/L (ref 96–106)
Creatinine, Ser: 0.73 mg/dL (ref 0.57–1.00)
GFR calc Af Amer: 116 mL/min/{1.73_m2} (ref >59)
GFR calc non Af Amer: 100 mL/min/{1.73_m2} (ref >59)
Globulin, Total: 2.2 g/dL (ref 1.5–4.5)
Glucose: 157 mg/dL — ABNORMAL HIGH (ref 65–99)
Potassium: 3.6 mmol/L (ref 3.5–5.2)
Sodium: 142 mmol/L (ref 134–144)
Total Protein: 6.3 g/dL (ref 6.0–8.5)

## 2020-05-21 LAB — CBC WITH DIFFERENTIAL/PLATELET
Basophils Absolute: 0 10*3/uL (ref 0.0–0.2)
Basos: 0 %
EOS (ABSOLUTE): 0.1 10*3/uL (ref 0.0–0.4)
Eos: 2 %
Hematocrit: 39.7 % (ref 34.0–46.6)
Hemoglobin: 13.2 g/dL (ref 11.1–15.9)
Immature Grans (Abs): 0 10*3/uL (ref 0.0–0.1)
Immature Granulocytes: 0 %
Lymphocytes Absolute: 1.2 10*3/uL (ref 0.7–3.1)
Lymphs: 18 %
MCH: 29.3 pg (ref 26.6–33.0)
MCHC: 33.2 g/dL (ref 31.5–35.7)
MCV: 88 fL (ref 79–97)
Monocytes Absolute: 0.3 10*3/uL (ref 0.1–0.9)
Monocytes: 5 %
Neutrophils Absolute: 5 10*3/uL (ref 1.4–7.0)
Neutrophils: 75 %
Platelets: 232 10*3/uL (ref 150–450)
RBC: 4.5 x10E6/uL (ref 3.77–5.28)
RDW: 13 % (ref 11.7–15.4)
WBC: 6.7 10*3/uL (ref 3.4–10.8)

## 2020-05-21 LAB — QUANTIFERON-TB GOLD PLUS
QuantiFERON Mitogen Value: >10 IU/mL
QuantiFERON Nil Value: 0 IU/mL
QuantiFERON TB1 Ag Value: 0 IU/mL
QuantiFERON TB2 Ag Value: 0 IU/mL
QuantiFERON-TB Gold Plus: NEGATIVE

## 2020-05-21 LAB — HEPATITIS B SURFACE ANTIGEN: Hepatitis B Surface Ag: NEGATIVE

## 2020-05-21 LAB — HIV ANTIBODY (ROUTINE TESTING W REFLEX): HIV Screen 4th Generation wRfx: NONREACTIVE

## 2020-05-21 LAB — HEPATITIS B SURFACE ANTIBODY,QUALITATIVE: Hep B Surface Ab, Qual: REACTIVE

## 2020-05-21 LAB — VITAMIN D 25 HYDROXY (VIT D DEFICIENCY, FRACTURES): Vit D, 25-Hydroxy: 29.2 ng/mL — ABNORMAL LOW (ref 30.0–100.0)

## 2020-05-21 LAB — HEPATITIS B CORE ANTIBODY, TOTAL: Hep B Core Total Ab: NEGATIVE

## 2020-05-21 MED ORDER — GADOBENATE DIMEGLUMINE 529 MG/ML IV SOLN
20.0000 mL | Freq: Once | INTRAVENOUS | Status: AC | PRN
  Administered 2020-05-21: 20 mL via INTRAVENOUS

## 2020-05-21 NOTE — Telephone Encounter (Signed)
-----   Message from Shawnie Dapper, NP sent at 05/21/2020 10:06 AM EST ----- Labs look ok. I have sent orders for her to restart DMT. Her glucose was a bit elevated but could have been due to non fasting labs. She should follow up with PCP regarding this. Her vitamin D was just a touch low. Have her continue OTC supplements as discussed and we will recheck at follow up.

## 2020-05-21 NOTE — Telephone Encounter (Signed)
Order for rituxin given to intrafusion.

## 2020-05-22 ENCOUNTER — Telehealth: Payer: Self-pay | Admitting: Neurology

## 2020-05-22 NOTE — Telephone Encounter (Signed)
MRI of the brain appears to be stable, the brain itself is normal, atrophy of the left optic nerve is seen.  I discussed this with the patient.  MRI brain 05/21/20:  IMPRESSION: This MRI of the brain with and without contrast shows the following: 1.   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 2.   Mild left chronic sinusitis.   3.   There were no acute findings and there was a normal enhancement pattern.

## 2020-05-22 NOTE — Telephone Encounter (Signed)
-----   Message from Guy Begin, RN sent at 05/22/2020  8:15 AM EST ----- [facility] and [facility] Ross out to day, can you give me a result note and I can call them.  Thank you Andrey Campanile RN ----- Message ----- From: Asa Lente, MD Sent: 05/21/2020  10:37 PM EST To: Shawnie Dapper, NP

## 2020-05-27 NOTE — Addendum Note (Signed)
Addended byNicholas Lose, Jazzelle Zhang L on: 05/27/2020 01:17 PM   Modules accepted: Orders

## 2020-05-27 NOTE — Addendum Note (Signed)
Addended by: Tamera Stands D on: 05/27/2020 03:18 PM   Modules accepted: Orders

## 2020-05-28 ENCOUNTER — Other Ambulatory Visit (INDEPENDENT_AMBULATORY_CARE_PROVIDER_SITE_OTHER): Payer: Self-pay

## 2020-05-28 DIAGNOSIS — Z0289 Encounter for other administrative examinations: Secondary | ICD-10-CM

## 2020-05-30 LAB — NEUROMYELITIS OPTICA AUTOAB, IGG: NMO IgG Autoantibodies: 2.2 U/mL (ref 0.0–3.0)

## 2020-06-02 ENCOUNTER — Encounter: Payer: Self-pay | Admitting: *Deleted

## 2020-11-04 ENCOUNTER — Encounter: Payer: Self-pay | Admitting: *Deleted

## 2020-11-17 ENCOUNTER — Ambulatory Visit: Payer: BC Managed Care – PPO | Admitting: Neurology

## 2020-11-19 ENCOUNTER — Ambulatory Visit: Payer: BC Managed Care – PPO | Admitting: Neurology

## 2020-11-19 ENCOUNTER — Encounter: Payer: Self-pay | Admitting: Neurology

## 2021-04-02 IMAGING — CT CT ANGIO CHEST
2 of 6 series · 18 of 46 positions shown · IV contrast (omnipaque)
Comparison: Chest radiograph dated 06/14/2019.

CLINICAL DATA: 43-year-old female with concern for pulmonary
embolism. Shortness of breath.

EXAM:
CT ANGIOGRAPHY CHEST WITH CONTRAST
TECHNIQUE: Multidetector CT imaging of the chest was performed using the
standard protocol during bolus administration of intravenous
contrast. Multiplanar CT image reconstructions and MIPs were
obtained to evaluate the vascular anatomy.
CONTRAST:  100mL OMNIPAQUE IOHEXOL 350 MG/ML SOLN

[Series 7: thins · axial · 0.69mm/px · z∈[+1185,+1398]mm · 15 of 235 slices shown]
[im 11/235  lung]
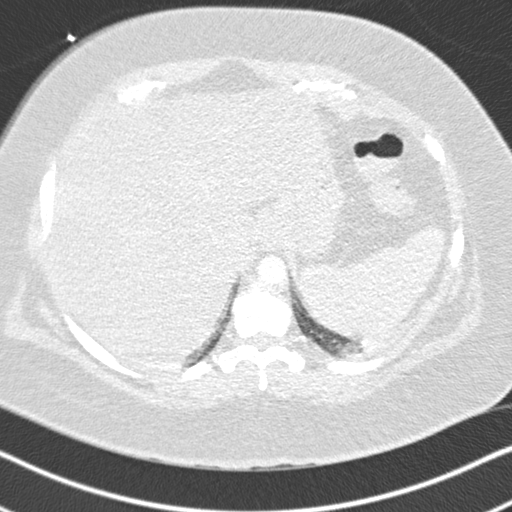
[im 31/235  soft-tissue]
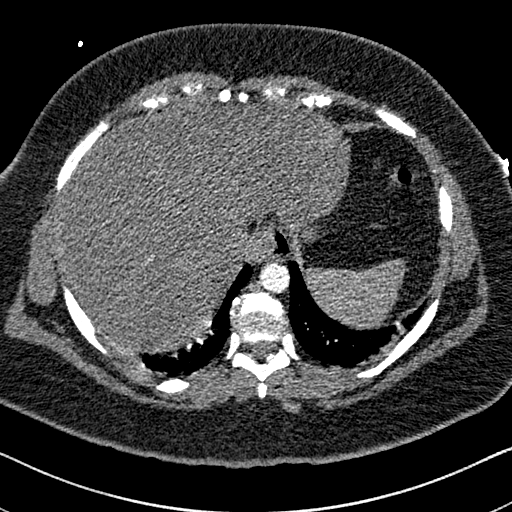
[im 41/235  lung]
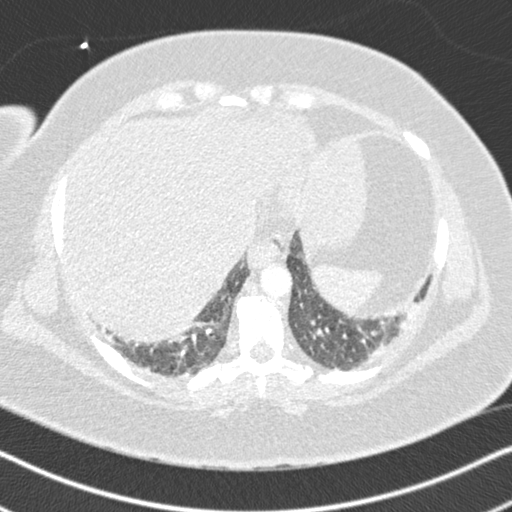
[im 62/235  soft-tissue]
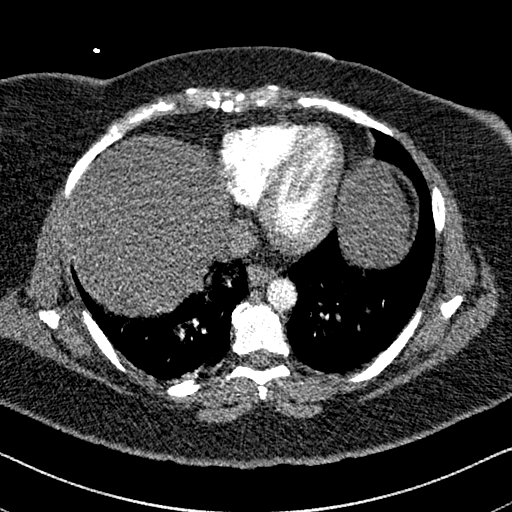
[im 72/235  lung]
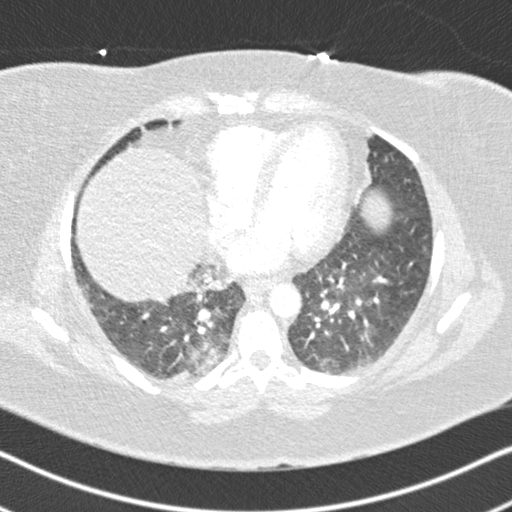
[im 92/235  soft-tissue]
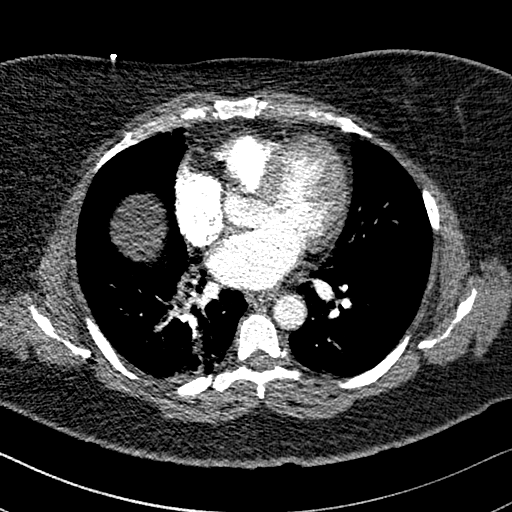
[im 102/235  lung]
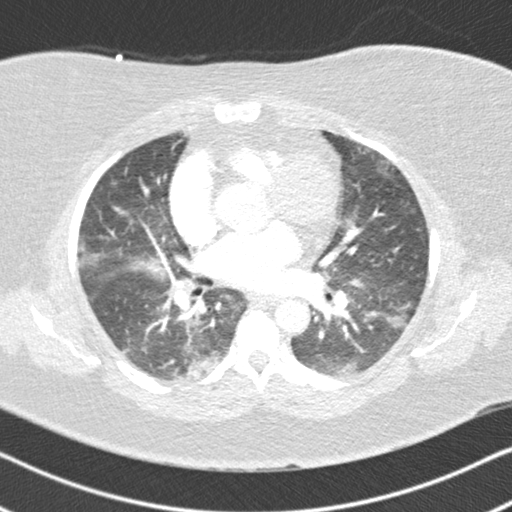
[im 123/235  soft-tissue]
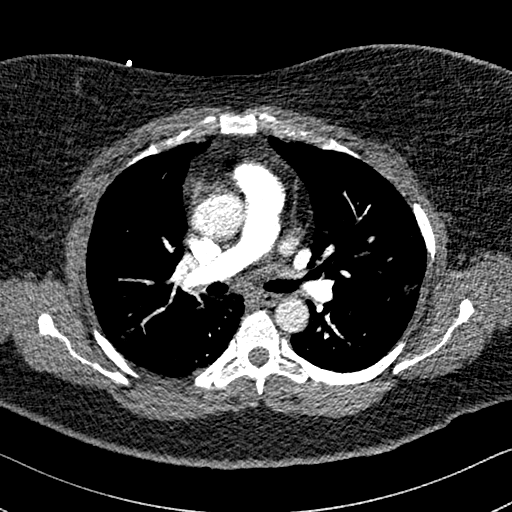
[im 133/235  lung]
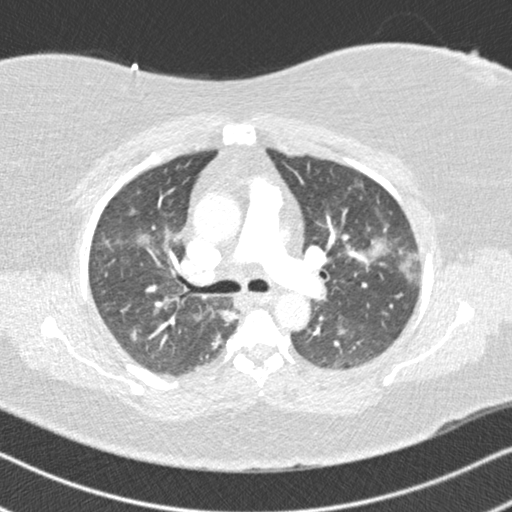
[im 143/235  soft-tissue]
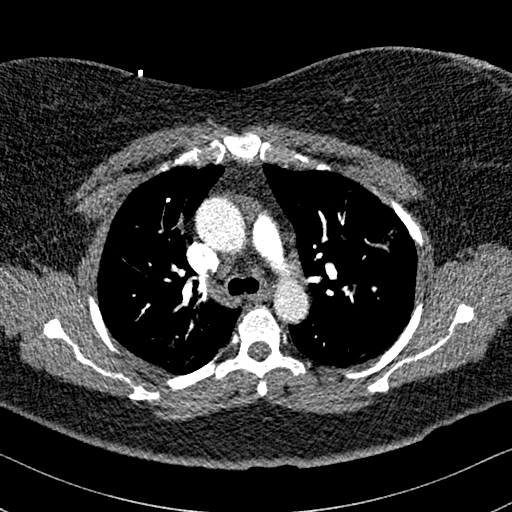
[im 163/235  lung]
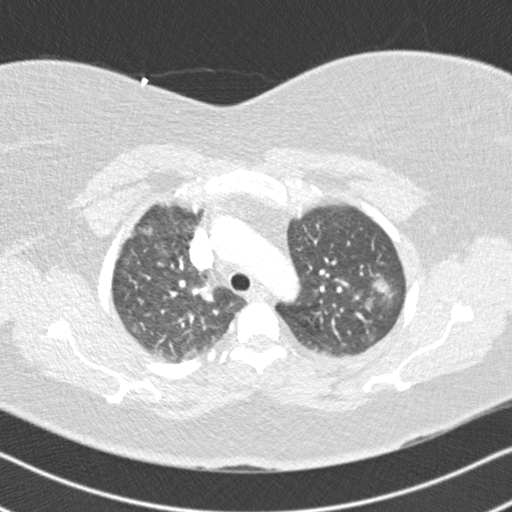
[im 173/235  soft-tissue]
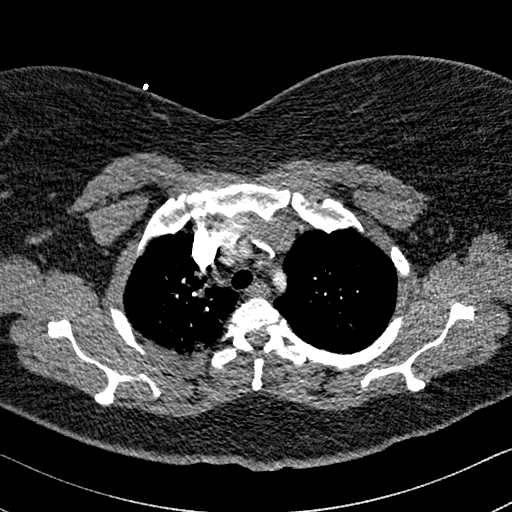
[im 194/235  lung]
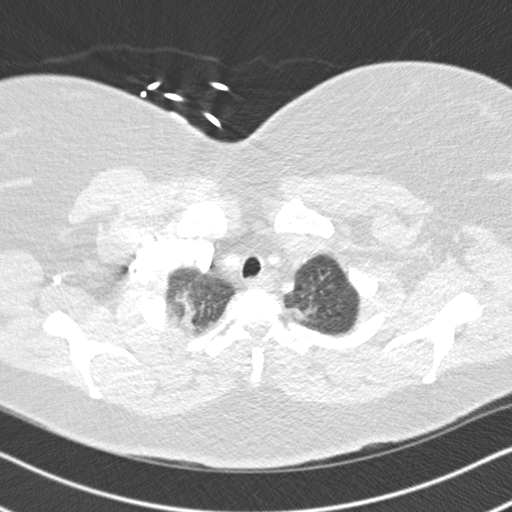
[im 204/235  soft-tissue]
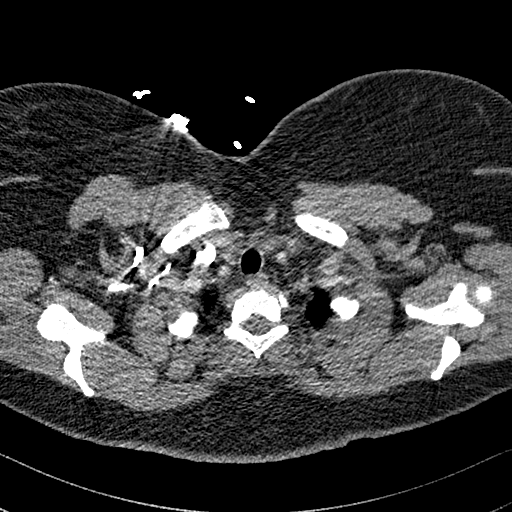
[im 224/235  lung]
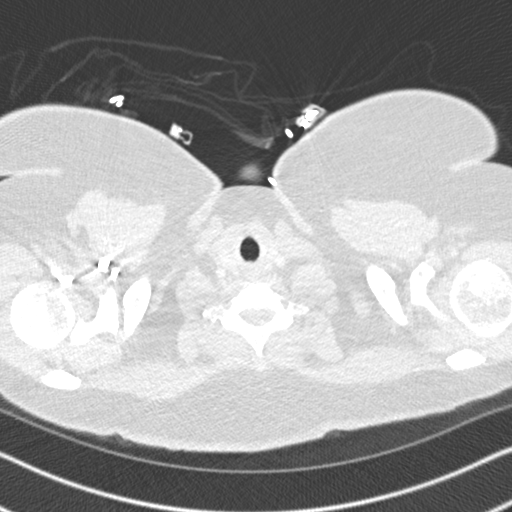

[Series 9: coronal mpr · coronal · 0.49mm/px · 3 of 151 slices shown]
[im 38/151  soft-tissue]
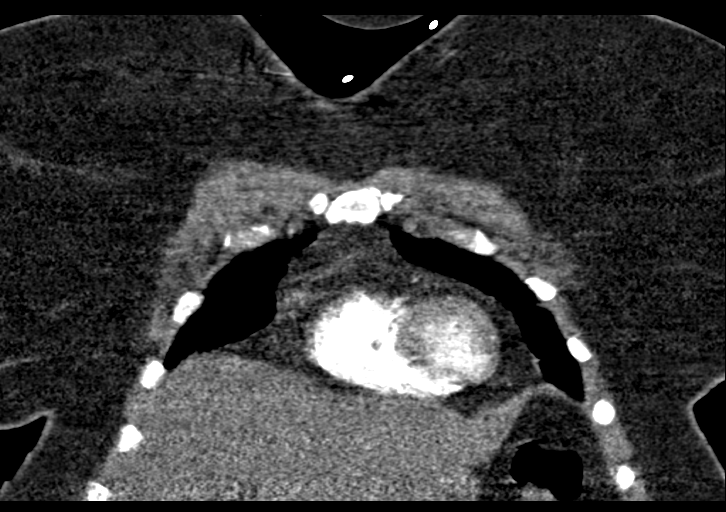
[im 76/151  soft-tissue]
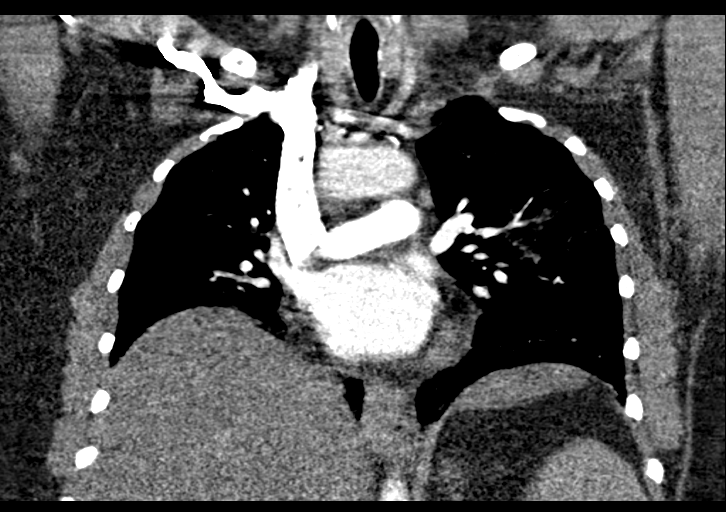
[im 113/151  soft-tissue]
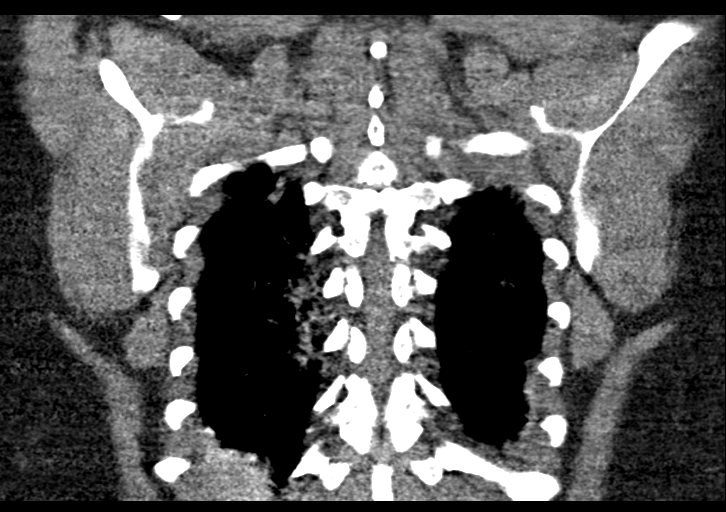

[18 of 46 positions shown; findings below may reference images not displayed]

FINDINGS: Cardiovascular: There is no cardiomegaly or pericardial effusion.
The thoracic aorta is unremarkable. Evaluation of the pulmonary
arteries is somewhat limited due to respiratory motion artifact. No
definite large central pulmonary artery embolus identified.

Mediastinum/Nodes: No hilar or mediastinal adenopathy. The esophagus
is grossly unremarkable. No mediastinal fluid collection.

Lungs/Pleura: Scattered clusters of ground-glass and nodular
airspace density throughout the lungs most consistent with
multifocal pneumonia, possibly atypical or viral in etiology.
Clinical correlation is recommended. There is no pleural effusion or
pneumothorax. The central airways are patent.

Upper Abdomen: Fatty infiltration of the liver. The visualized upper
abdomen is otherwise unremarkable.

Musculoskeletal: No chest wall abnormality. No acute or significant
osseous findings.

Review of the MIP images confirms the above findings.
IMPRESSION: 1. No CT evidence of central pulmonary artery embolus.
2. Multifocal pneumonia, possibly atypical or viral in etiology.
Clinical correlation is recommended.

## 2021-04-02 IMAGING — DX DG CHEST 1V PORT
1 series · 1 of 1 positions shown · non-contrast
Comparison: Chest radiograph [DATE]

CLINICAL DATA: Shortness of breath. Cough and shortness of breath.
COVID (+) [REDACTED].

EXAM:
PORTABLE CHEST 1 VIEW

[chest]
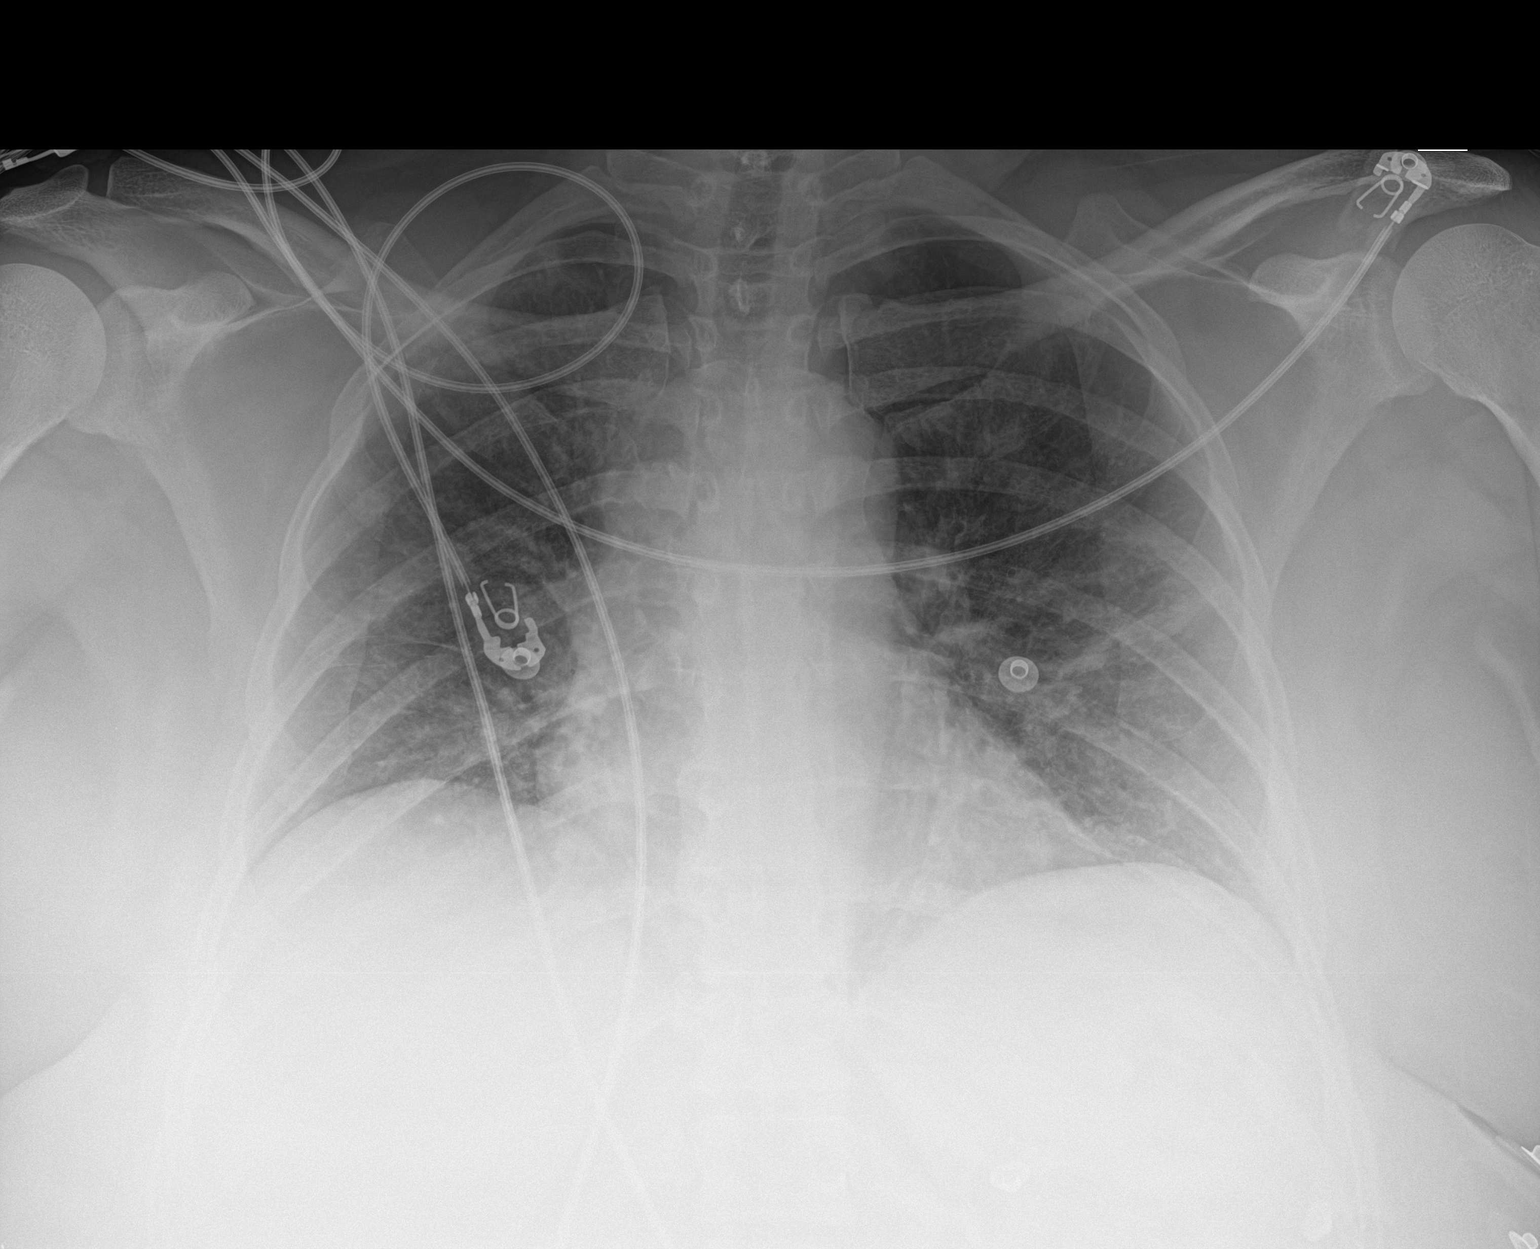

[1 of 1 positions shown; findings below may reference images not displayed]

FINDINGS: Heart size within normal limits.

Redemonstrated mild nonspecific interstitial prominence. New linear
opacities within the left mid lung.

No evidence of pleural effusion or pneumothorax.

No acute bony abnormality.

Overlying cardiac monitoring leads.
IMPRESSION: Redemonstrated nonspecific mild interstitial prominence, which may
reflect sequela of atypical/viral pneumonia. New curvilinear
opacities within the left mid lung could reflect the same process or
platelike atelectasis.

## 2021-10-19 ENCOUNTER — Ambulatory Visit (INDEPENDENT_AMBULATORY_CARE_PROVIDER_SITE_OTHER): Payer: BC Managed Care – PPO

## 2021-10-19 ENCOUNTER — Ambulatory Visit: Payer: BC Managed Care – PPO | Admitting: Podiatry

## 2021-10-19 DIAGNOSIS — M7742 Metatarsalgia, left foot: Secondary | ICD-10-CM | POA: Diagnosis not present

## 2021-10-19 DIAGNOSIS — M7752 Other enthesopathy of left foot: Secondary | ICD-10-CM | POA: Diagnosis not present

## 2021-10-19 DIAGNOSIS — M79672 Pain in left foot: Secondary | ICD-10-CM

## 2021-10-19 MED ORDER — MELOXICAM 7.5 MG PO TABS: 7.5000 mg | ORAL_TABLET | Freq: Every day | ORAL | 0 refills | Status: AC | PRN

## 2021-10-19 MED ORDER — TRIAMCINOLONE ACETONIDE 10 MG/ML IJ SUSP
5.0000 mg | Freq: Once | INTRAMUSCULAR | Status: AC
  Administered 2021-10-19: 5 mg

## 2021-10-19 NOTE — Patient Instructions (Signed)

## 2021-10-21 NOTE — Progress Notes (Signed)
Subjective:  ? ?Patient ID: Mary Ross, female   DOB: 46 y.o.   MRN: [id number]  ? ?HPI ?46 year old female presents the office with concerns of left foot pain mostly to the outer aspect which been ongoing since December.  She denies any recent injury or trauma.  No recent treatment.  She said the pain is not excruciating but she notices discomfort particular when she goes up on the ball of her foot. ? ? ?Review of Systems  ?All other systems reviewed and are negative. ? ?Past Medical History:  ?Diagnosis Date  ? Depression 09/26/2014  ? Headache   ? Neuromyelitis optica (devic) (HCC) 2017  ? Vision abnormalities   ? ? ?Past Surgical History:  ?Procedure Laterality Date  ? CESAREAN SECTION    ? CHOLECYSTECTOMY OPEN    ? ? ? ?Current Outpatient Medications:  ?  meloxicam (MOBIC) 7.5 MG tablet, Take 1 tablet (7.5 mg total) by mouth daily as needed for pain., Disp: 14 tablet, Rfl: 0 ?  escitalopram (LEXAPRO) 10 MG tablet, Take 10 mg by mouth daily., Disp: , Rfl:  ?  Liraglutide -Weight Management (SAXENDA) 18 MG/3ML SOPN, Inject into the skin., Disp: , Rfl:  ?  riTUXimab (RITUXAN) 100 MG/10ML injection, Inject 1,000 mg into the vein., Disp: , Rfl:  ? ?No Known Allergies ? ? ? ? ?   ?Objective:  ?Physical Exam  ?General: AAO x3, NAD ? ?Dermatological: Skin is warm, dry and supple bilateral. There are no open sores, no preulcerative lesions, no rash or signs of infection present. ? ?Vascular: Dorsalis Pedis artery and Posterior Tibial artery pedal pulses are 2/4 bilateral with immedate capillary fill time.There is no pain with calf compression, swelling, warmth, erythema.  ? ?Neruologic: Grossly intact via light touch bilateral.  Negative Tinel sign. ? ?Musculoskeletal: There is tenderness outpatient on the left fourth interspace of the left foot.  Small bursa versus possible neuroma at the interspace.  There is prominence of metatarsal head plantarly with atrophy of the fat pad as well.  There is no area of pinpoint  tenderness.  No edema, erythema.  No pain with imaging range of motion.  Muscular strength 5/5 in all groups tested bilateral. ? ?Gait: Unassisted, Nonantalgic.  ? ? ?   ?Assessment:  ? ?Bursitis, metatarsalgia left foot ? ?   ?Plan:  ?-Treatment options discussed including all alternatives, risks, and complications ?-Etiology of symptoms were discussed ?-X-rays were obtained and reviewed with the patient.  3 views of the left foot were obtained and reviewed.  No evidence of acute fracture noted. ?-Steroid injection performed to the interspace.  Skin was cleaned with alcohol and mixture of 1 cc Kenalog 10, 0.5 cc of Marcaine plain, 0.5 cc of lidocaine plain was infiltrated into the interspace without complications.  Postinjection care discussed.  Tolerated well. ?-Dispensed metatarsal pads.  Discussion modifications with good arch support. ? ?Return if symptoms worsen or fail to improve. ? ?Vivi Barrack DPM ? ?   ? ?
